# Patient Record
Sex: Female | Born: 2000 | Race: White | Hispanic: No | Marital: Married | Smoking: Never smoker
Health system: Southern US, Community
[De-identification: ages and names within clinical notes are randomized; demographics above are authoritative.]

## PROBLEM LIST (undated history)

## (undated) DIAGNOSIS — L709 Acne, unspecified: Secondary | ICD-10-CM

## (undated) DIAGNOSIS — T7840XA Allergy, unspecified, initial encounter: Secondary | ICD-10-CM

## (undated) HISTORY — DX: Acne, unspecified: L70.9

## (undated) HISTORY — DX: Allergy, unspecified, initial encounter: T78.40XA

---
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
## 2018-01-13 ENCOUNTER — Other Ambulatory Visit: Payer: Self-pay | Admitting: Family Medicine

## 2018-01-13 DIAGNOSIS — Z30011 Encounter for initial prescription of contraceptive pills: Secondary | ICD-10-CM

## 2018-01-13 NOTE — Telephone Encounter (Signed)
Pt needing refills on:  norethindrone-ethinyl estradiol (MICROGESTIN,JUNEL,LOESTRIN) 1-20 MG-MCG tablet Pt only has 3 days left of Rx.   Please fill at:  [facility] [address] [place] [phone] (Phone) [phone] (Fax)    Thanks, [employer]

## 2018-03-03 ENCOUNTER — Ambulatory Visit (INDEPENDENT_AMBULATORY_CARE_PROVIDER_SITE_OTHER): Payer: 59 | Admitting: Family Medicine

## 2018-03-03 ENCOUNTER — Encounter: Payer: Self-pay | Admitting: Family Medicine

## 2018-03-03 VITALS — BP 118/82 | HR 110 | Temp 98.4°F | Wt 145.6 lb

## 2018-03-03 DIAGNOSIS — J069 Acute upper respiratory infection, unspecified: Secondary | ICD-10-CM | POA: Diagnosis not present

## 2018-03-03 NOTE — Patient Instructions (Signed)
Discussed use of Mucinex D for congestion, Delsym for cough, and Benadryl for postnasal drainage 

## 2018-03-03 NOTE — Progress Notes (Signed)
  Subjective:     Patient ID: Shelly Andersen, female   DOB: 02-21-2000, 18 y.o.   MRN: [id number] Chief Complaint  Patient presents with  . URI    Patient presents today for upper respiratory infection since Thursday, 02/27/2018. Patient is having the following symptoms: cough, scratchy throat, ear pressure and pain.    HPI Reports sinus and ear pressure but no fever or chills. Has taken Mucinex for her sx.  Review of Systems     Objective:   Physical Exam Constitutional:      General: She is not in acute distress.    Appearance: She is ill-appearing.  Neurological:     Mental Status: She is alert.   Ears: T.M's intact without inflammation Sinuses: mild maxillary sinus tenderness Throat: tonsils are absent. Neck: no cervical adenopathy Lungs: clear     Assessment:    1. URI, acute     Plan:    Discussed use of Mucinex D for congestion, Delsym for cough, and Benadryl for postnasal drainage. Will call if sinuses to improving over the course of the week.

## 2018-12-01 ENCOUNTER — Telehealth: Payer: Self-pay | Admitting: Family Medicine

## 2018-12-01 DIAGNOSIS — N6310 Unspecified lump in the right breast, unspecified quadrant: Secondary | ICD-10-CM

## 2018-12-01 NOTE — Telephone Encounter (Signed)
Pt returned missed call.  Please call pt back. ° °Thanks, °TGH °

## 2018-12-01 NOTE — Telephone Encounter (Signed)
-----   Message from Birdie Sons, MD sent at 12/16/2017  7:53 AM EDT ----- Regarding: FW: repeat u/s right breast 6 months (12/10/2017)  Done 12-11-2017. Repeat in 12 months.    ----- Message ----- From: Birdie Sons, MD Sent: 12/12/2017 To: Birdie Sons, MD Subject: FW: repeat u/s right breast 6 months (10/22/#    ----- Message ----- From: Birdie Sons, MD Sent: 12/06/2017 To: Birdie Sons, MD Subject: FW: repeat u/s right breast 6 months (10/22/#    ----- Message ----- From: Birdie Sons, MD Sent: 06/18/2017 To: Birdie Sons, MD Subject: repeat u/s right breast 6 months (May 2019)

## 2018-12-01 NOTE — Telephone Encounter (Signed)
Patient has been advised, please schedule as below. Thanks. KW

## 2018-12-01 NOTE — Telephone Encounter (Signed)
[id number] 

## 2018-12-01 NOTE — Telephone Encounter (Signed)
Please advise patient is time for follow up ultrasound right breast (see u/s 12/11/2017)

## 2018-12-15 ENCOUNTER — Telehealth: Payer: Self-pay

## 2018-12-15 ENCOUNTER — Ambulatory Visit
Admission: RE | Admit: 2018-12-15 | Discharge: 2018-12-15 | Disposition: A | Discharge status: Home / Self Care | Payer: Managed Care, Other (non HMO) | Source: Ambulatory Visit | Attending: Family Medicine | Admitting: Family Medicine

## 2018-12-15 ENCOUNTER — Encounter: Payer: Self-pay | Admitting: Family Medicine

## 2018-12-15 DIAGNOSIS — N6310 Unspecified lump in the right breast, unspecified quadrant: Secondary | ICD-10-CM | POA: Diagnosis present

## 2018-12-15 NOTE — Telephone Encounter (Signed)
-----   Message from Birdie Sons, MD sent at 12/15/2018 12:07 PM EDT ----- Follow up ultrasound indicates mass Is getting smaller and is very likely a benign fibroadenoma. No need for any further imaging until she is 38 unless there is dramatic change.

## 2018-12-15 NOTE — Telephone Encounter (Signed)
LMTCB

## 2018-12-17 NOTE — Telephone Encounter (Signed)
Patient was contacted and notified of the providers recommendations and results. She gave verbal understanding. Message from Birdie Sons, MD sent at 12/15/2018 12:07 PM EDT ----- Follow up ultrasound indicates mass Is getting smaller and is very likely a benign fibroadenoma. No need for any further imaging until she is 38 unless there is dramatic change.

## 2018-12-17 NOTE — Telephone Encounter (Signed)
Pt returned call ° °Teri °

## 2018-12-17 NOTE — Telephone Encounter (Signed)
LMTCB

## 2019-01-16 ENCOUNTER — Other Ambulatory Visit: Payer: Self-pay | Admitting: Family Medicine

## 2019-01-16 DIAGNOSIS — Z30011 Encounter for initial prescription of contraceptive pills: Secondary | ICD-10-CM

## 2019-01-16 MED ORDER — NORETHINDRONE ACET-ETHINYL EST 1-20 MG-MCG PO TABS: 1.0000 | ORAL_TABLET | Freq: Every day | ORAL | 3 refills | Status: DC

## 2019-01-16 NOTE — Telephone Encounter (Signed)
[facility] faxed refill request for the following medications:  MICROGESTIN 1-20 MG-MCG tablet  Last Rx: 01/13/2018 90 day supply with 3 refills LOV: 03/03/2018 with Mikki Santee 02/20/2017 with Dr. Caryn Section Please advise. Thanks TNP

## 2019-02-13 IMAGING — US ULTRASOUND RIGHT BREAST LIMITED
1 series · 9 of 9 positions shown · non-contrast
Comparison: None.

CLINICAL DATA: 16-year-old female complaining of palpable mass in
the right breast.

EXAM:
ULTRASOUND OF THE RIGHT BREAST

[Series 1: ultrasound right breast limited · 0.04mm/px · 9 of 9 slices shown]
[im 1/9]
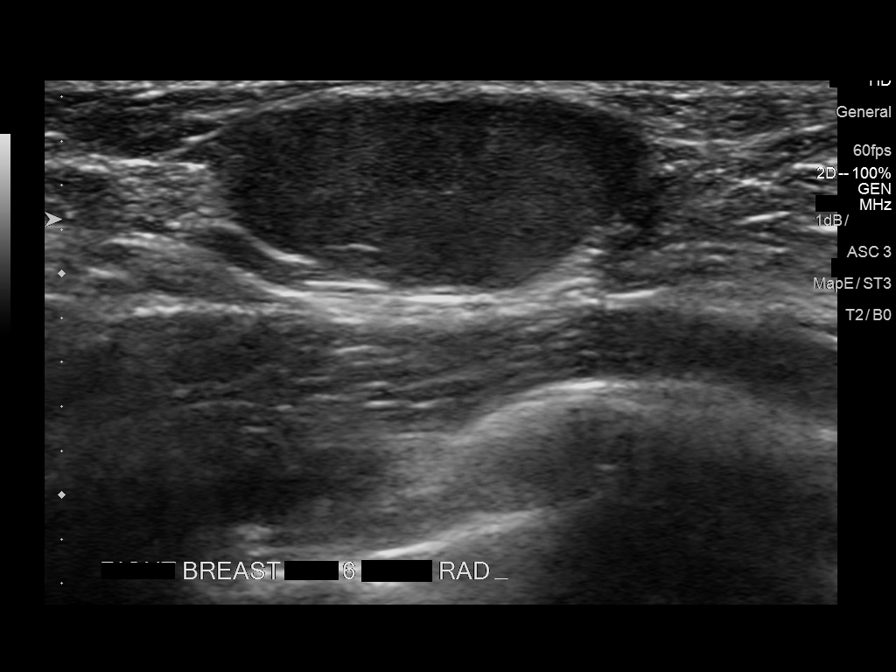
[im 2/9]
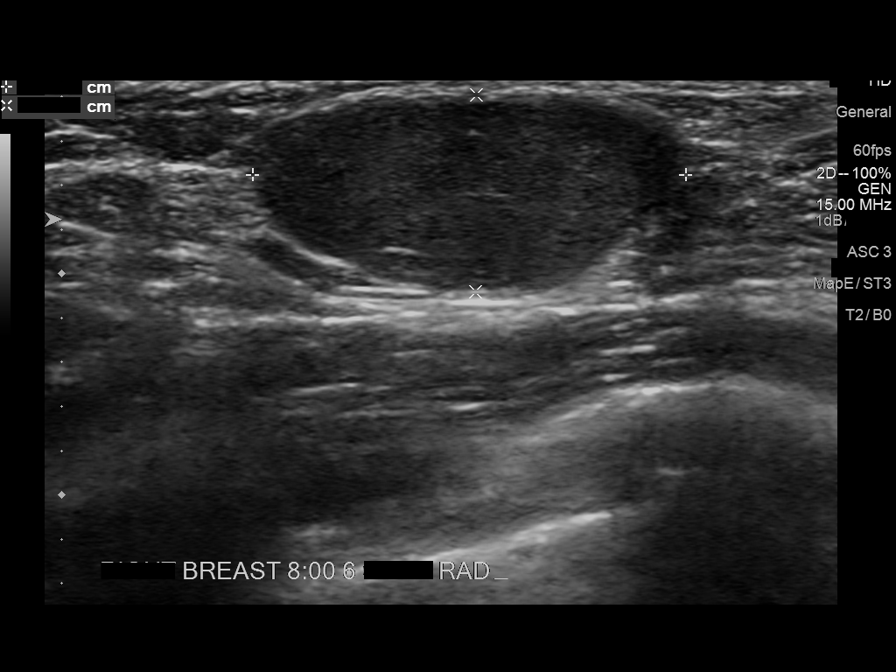
[im 3/9]
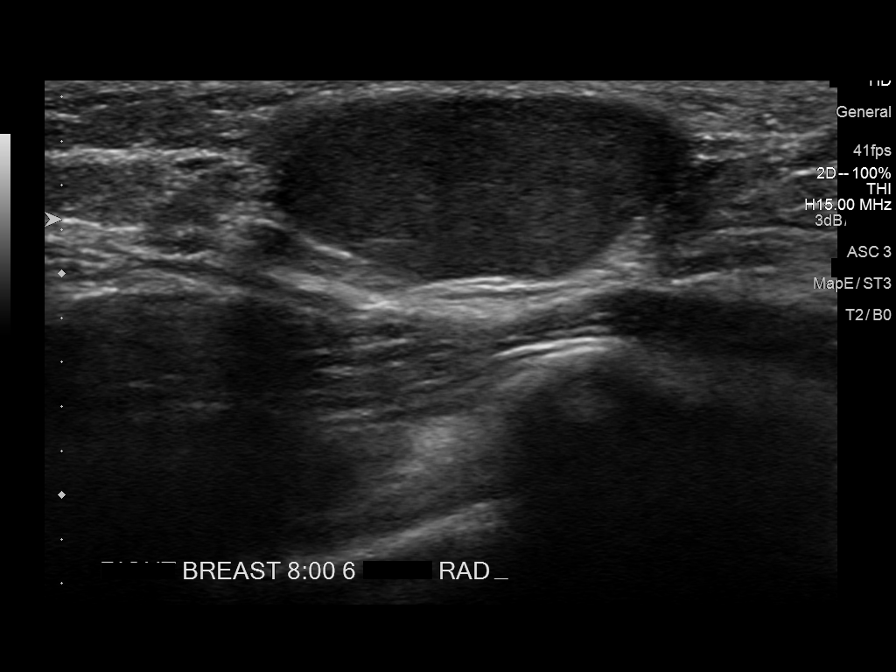
[im 4/9]
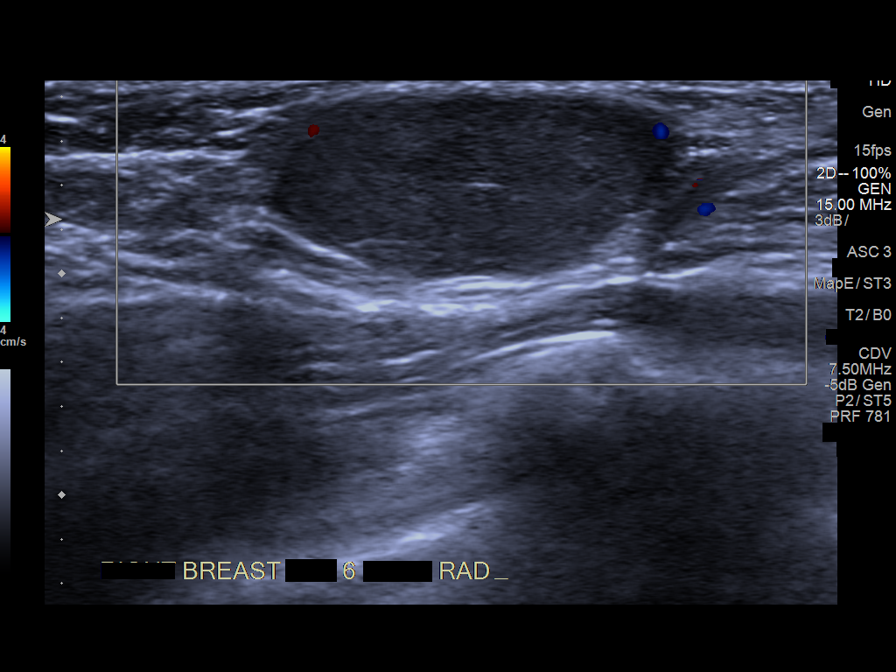
[im 5/9]
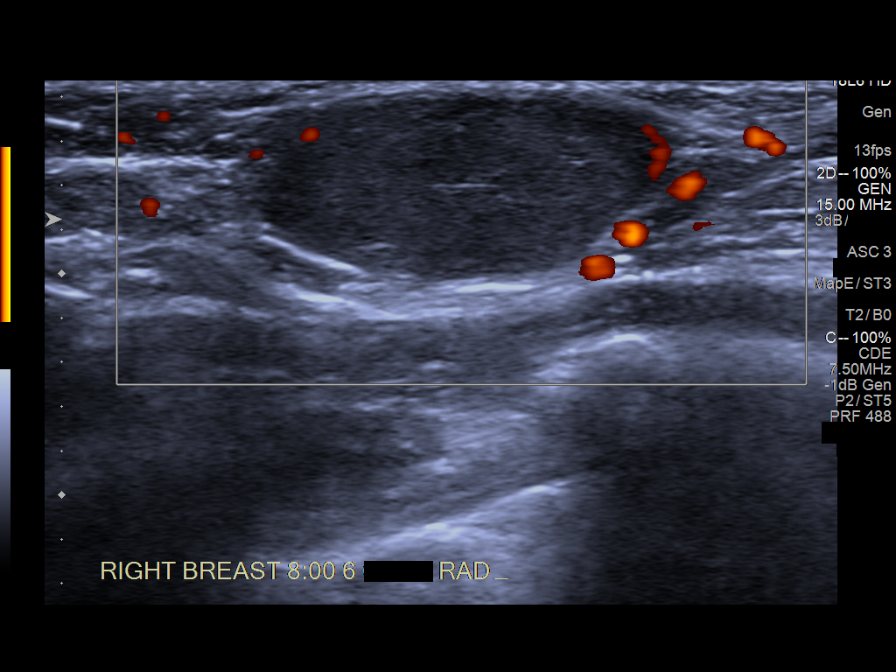
[im 6/9]
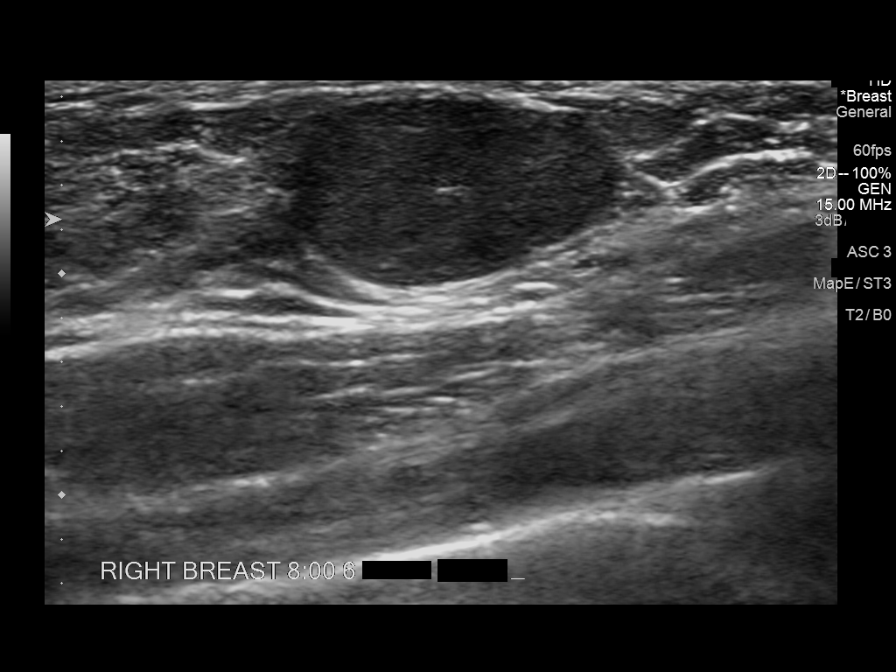
[im 7/9]
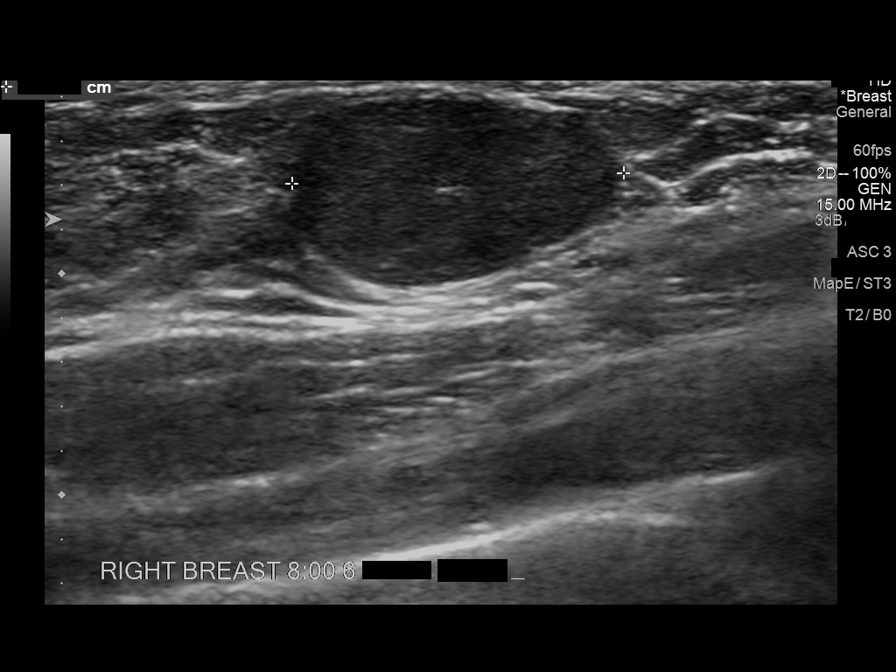
[im 8/9]
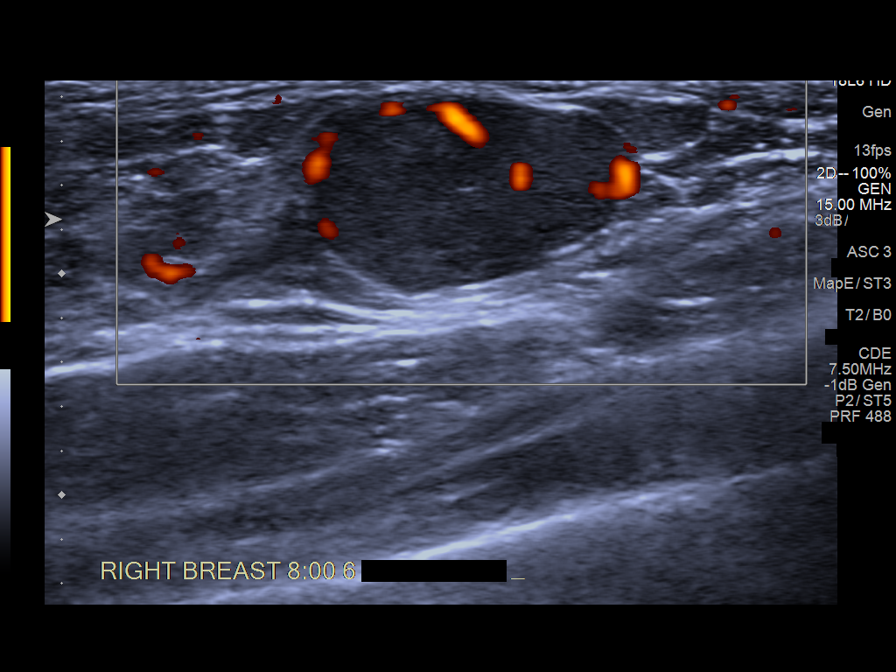
[im 9/9]
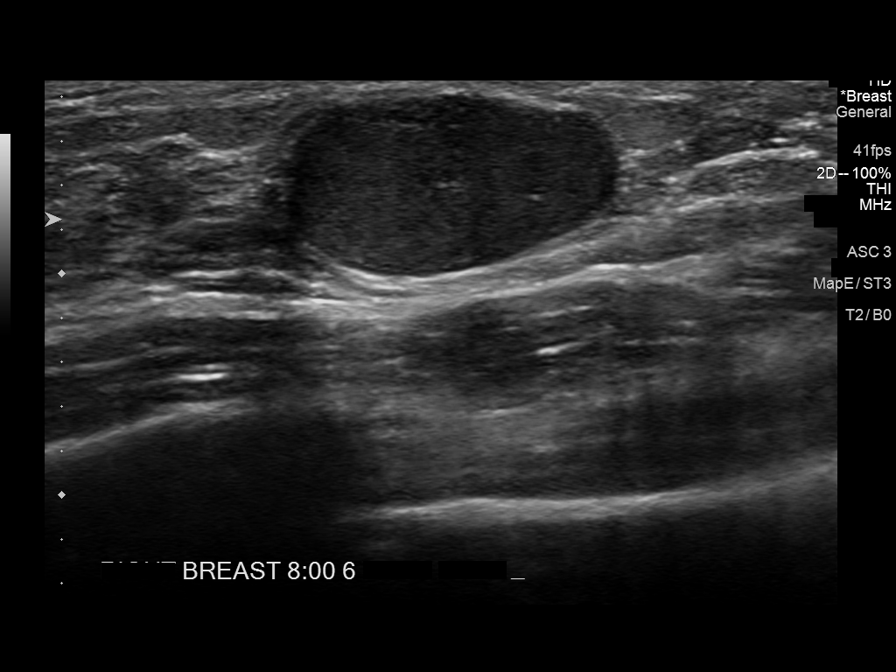

[9 of 9 positions shown; findings below may reference images not displayed]

FINDINGS: On physical exam, I palpate a freely mobile mass in the right breast
at 8 o'clock 6 cm from the nipple.

Targeted ultrasound is performed, showing a well-circumscribed
hypoechoic mass in the right breast at 8 o'clock 6 cm from the
nipple with increased through transmission measuring 2.0 x 0.9 x
cm. This is likely a benign fibroadenoma.
IMPRESSION: Probable benign fibroadenoma in the right breast.

RECOMMENDATION:
Short-term interval follow-up ultrasound in 6 months,
ultrasound-guided core biopsy and surgical excision were discussed
with the patient and her mother. Short-term interval follow-up
ultrasound in 6 months will be performed. The importance of
self-breast examination was discussed.

I have discussed the findings and recommendations with the patient.
Results were also provided in writing at the conclusion of the
visit. If applicable, a reminder letter will be sent to the patient
regarding the next appointment.

BI-RADS CATEGORY  3: Probably benign.

## 2019-04-14 ENCOUNTER — Encounter: Payer: Self-pay | Admitting: Orthopaedic Surgery

## 2019-04-14 ENCOUNTER — Other Ambulatory Visit: Payer: Self-pay

## 2019-04-14 ENCOUNTER — Ambulatory Visit (INDEPENDENT_AMBULATORY_CARE_PROVIDER_SITE_OTHER): Payer: Self-pay

## 2019-04-14 ENCOUNTER — Ambulatory Visit (INDEPENDENT_AMBULATORY_CARE_PROVIDER_SITE_OTHER): Payer: Self-pay | Admitting: Orthopaedic Surgery

## 2019-04-14 DIAGNOSIS — M25561 Pain in right knee: Secondary | ICD-10-CM

## 2019-04-14 MED ORDER — TRAMADOL HCL 50 MG PO TABS: 50.0000 mg | ORAL_TABLET | Freq: Three times a day (TID) | ORAL | 1 refills | Status: DC | PRN

## 2019-04-14 NOTE — Progress Notes (Signed)
Office Visit Note   Patient: Shelly Andersen           Date of Birth: 2000-07-31           MRN: [id number] Visit Date: 04/14/2019              Requested by: Birdie Sons, [place],   [id number] PCP: Birdie Sons, MD   Assessment & Plan: Visit Diagnoses:  1. Acute pain of right knee     Plan: Impression is right knee questionable ligamentous injury versus meniscal pathology.  I have called in tramadol.  We will place the patient in a hinged knee brace weightbearing as tolerated.  She will avoid any activity for now.  Ice and elevate for pain and swelling.  We will obtain an MRI at this point to assess for internal derangement.  Follow-up with [place] once has been completed.  Follow-Up Instructions: Return after mri.   Orders:  Orders Placed This Encounter  Procedures  . XR KNEE 3 VIEW RIGHT  . MR Knee Right w/o contrast   Meds ordered this encounter  Medications  . traMADol (ULTRAM) 50 MG tablet    Sig: Take 1 tablet (50 mg total) by mouth 3 (three) times daily as needed.    Dispense:  20 tablet    Refill:  1      Procedures: No procedures performed   Clinical Data: No additional findings.   Subjective: Chief Complaint  Patient presents with  . 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  This occurred approximately 1 week ago while playing volleyball.  She notes that she was in somewhat of a squatting position when she went for a ball into her knee looked into what sounds like valgus.  She had immediate pain and heard a pop to the knee.  She has had pain to the entire knee since.  She describes this as a sharp shooting pain with instability and occasional locking.  Pain is aggravated with bearing weight.  She has been taking ibuprofen without relief of symptoms.  No numbness, tingling or burning.  Review of Systems as detailed in  HPI.  All others reviewed and are negative.   Objective: Vital Signs: There were no vitals taken for this visit.  Physical Exam well-developed and well-nourished female in no acute distress.  Alert and oriented x3.  Ortho Exam examination of her right knee reveals a trace effusion.  Range of motion 0-90 degrees.  She has marked tenderness to the medial and lateral joint lines.  She does have pain with valgus and varus stress, but she does not really open up.  Negative anterior drawer and Lachman.  She is neurovascular intact distally.  Specialty Comments:  No specialty comments available.  Imaging: XR KNEE 3 VIEW RIGHT  Result Date: 04/14/2019 No acute or structural abnormalities    PMFS History: Patient Active Problem List   Diagnosis Date Noted  . 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 Acne 12/08/2014  . Allergic rhinitis 12/08/2014  . Sprain of hand, thumb, right 12/08/2014   Past Medical History:  Diagnosis Date  . Acne   . Allergy     Family History  Problem Relation Age of Onset  . Hypertension Mother   . Migraines Mother   . Stroke Mother  x 2  . Colon polyps Mother   . Hypertension Father   . Migraines Brother   . Heart disease Other   . Pancreatic cancer Maternal Grandmother   . Heart attack Maternal Grandfather   . Kidney failure Paternal Grandfather     Past Surgical History:  Procedure Laterality Date  . TONSILLECTOMY AND ADENOIDECTOMY  2002   Social History   Occupational History  . Occupation: Ship broker    Comment: currently in 10th grade  Tobacco Use  . Smoking status: Never Smoker  . Smokeless tobacco: Never Used  Substance and Sexual Activity  . Alcohol use: No    Alcohol/week: 0.0 standard drinks  . Drug use: No  . Sexual activity: Not on file

## 2019-04-15 ENCOUNTER — Ambulatory Visit: Payer: Managed Care, Other (non HMO) | Admitting: Orthopaedic Surgery

## 2019-04-17 ENCOUNTER — Telehealth: Payer: Self-pay | Admitting: Orthopaedic Surgery

## 2019-04-17 NOTE — Telephone Encounter (Signed)
Pt stated she had an appt on 04-13-19 and was told pain medication would be called in, pt states she called her [facility] on [address] and the prescription hasn't beencalled in.

## 2019-04-17 NOTE — Telephone Encounter (Signed)
Patient aware that it was actually sent into the [facility] on her chart. She said that was fine but to take off the [facility] and to have [facility] be her only pharmacy on her chart

## 2019-05-05 ENCOUNTER — Encounter: Payer: Self-pay | Admitting: Physician Assistant

## 2019-05-05 ENCOUNTER — Ambulatory Visit (INDEPENDENT_AMBULATORY_CARE_PROVIDER_SITE_OTHER): Payer: BC Managed Care – PPO | Admitting: Physician Assistant

## 2019-05-05 ENCOUNTER — Other Ambulatory Visit: Payer: Self-pay

## 2019-05-05 VITALS — BP 115/79 | HR 101 | Temp 97.5°F | Wt 149.0 lb

## 2019-05-05 DIAGNOSIS — K121 Other forms of stomatitis: Secondary | ICD-10-CM

## 2019-05-05 DIAGNOSIS — K0510 Chronic gingivitis, plaque induced: Secondary | ICD-10-CM

## 2019-05-05 MED ORDER — MAGIC MOUTHWASH: 5.0000 mL | Freq: Three times a day (TID) | ORAL | 0 refills | Status: DC | PRN

## 2019-05-05 MED ORDER — VALACYCLOVIR HCL 1 G PO TABS: 1000.0000 mg | ORAL_TABLET | Freq: Two times a day (BID) | ORAL | 0 refills | Status: DC

## 2019-05-05 NOTE — Progress Notes (Signed)
Patient: Shelly Andersen Female    DOB: November 05, 2000   19 y.o.   MRN: [id number] Visit Date: 05/05/2019  Today's Provider: Trinna Post, PA-C   Chief Complaint  Patient presents with  . Oral Swelling    Gums and tongue.    Subjective:     HPI   Patient presents today with swollen gums and mouth sores x several days. She reports this happened over night. Reports her gums are swollen and tender. She does go to a dentist regularly. She is having mouth pain that is interfering with eating. Patient has been eating soft foods like mashed potatoes and maccaroni and cheese due to the pain. She denies fevers, history of cold sores. She did recently restart her combination OCP in January. She is currently having her period.   No Known Allergies   Current Outpatient Medications:  .  norethindrone-ethinyl estradiol (MICROGESTIN) 1-20 MG-MCG tablet, Take 1 tablet by mouth daily. 21 days on, 7 days off, Disp: 63 tablet, Rfl: 3 .  HYDROcodone-homatropine (HYCODAN) 5-1.5 MG/5ML syrup, Take 5 mLs by mouth every 8 (eight) hours as needed for cough. (Patient not taking: Reported on 04/14/2019), Disp: 100 mL, Rfl: 0 .  magic mouthwash SOLN, Take 5 mLs by mouth 3 (three) times daily as needed for mouth pain., Disp: 240 mL, Rfl: 0 .  methocarbamol (ROBAXIN) 500 MG tablet, Take 1 tablet (500 mg total) by mouth 2 (two) times daily as needed for muscle spasms (1 PO BID PRN). (Patient not taking: Reported on 04/14/2019), Disp: 20 tablet, Rfl: 0 .  traMADol (ULTRAM) 50 MG tablet, Take 1 tablet (50 mg total) by mouth 3 (three) times daily as needed., Disp: 20 tablet, Rfl: 1 .  valACYclovir (VALTREX) 1000 MG tablet, Take 1 tablet (1,000 mg total) by mouth 2 (two) times daily., Disp: 21 tablet, Rfl: 0  Review of Systems  Constitutional: Negative.   HENT: Positive for mouth sores and trouble swallowing. Negative for congestion, ear discharge, ear pain, postnasal drip, rhinorrhea, sinus pressure, sinus pain  and sore throat.   Neurological: Negative for dizziness, light-headedness and headaches.    Social History   Tobacco Use  . Smoking status: Never Smoker  . Smokeless tobacco: Never Used  Substance Use Topics  . Alcohol use: No    Alcohol/week: 0.0 standard drinks      Objective:   BP 115/79 (BP Location: Left Arm, Patient Position: Sitting, Cuff Size: Large)   Pulse (!) 101   Temp (!) 97.5 F (36.4 C) (Temporal)   Wt 149 lb (67.6 kg)   BMI 22.99 kg/m  Vitals:   05/05/19 1511  BP: 115/79  Pulse: (!) 101  Temp: (!) 97.5 F (36.4 C)  TempSrc: Temporal  Weight: 149 lb (67.6 kg)  Body mass index is 22.99 kg/m.   Physical Exam Constitutional:      Appearance: Normal appearance. She is not ill-appearing.  HENT:     Mouth/Throat:     Lips: Pink.     Mouth: Oral lesions present.     Dentition: Gingival swelling present. No dental tenderness, dental caries, dental abscesses or gum lesions.     Comments: Multiple ulcerative lesions on tongue and gumlines.  Neurological:     Mental Status: She is alert and oriented to person, place, and time. Mental status is at baseline.  Psychiatric:        Mood and Affect: Mood normal.        Behavior: Behavior  normal.      No results found for any visits on 05/05/19.     Assessment & Plan    1. Gingivitis  Will treat for HSV infection due to gingival pain and mouth ulcers. Have sent in magic mouthwash for pain relief. If these is not working, will consider changing birth control.   - valACYclovir (VALTREX) 1000 MG tablet; Take 1 tablet (1,000 mg total) by mouth 2 (two) times daily.  Dispense: 21 tablet; Refill: 0 - magic mouthwash SOLN; Take 5 mLs by mouth 3 (three) times daily as needed for mouth pain.  Dispense: 240 mL; Refill: 0  2. Mouth ulcers  The entirety of the information documented in the History of Present Illness, Review of Systems and Physical Exam were personally obtained by me. Portions of this information were  initially documented by Ashley Royalty, CMA and reviewed by me for thoroughness and accuracy.        Trinna Post, PA-C  [facility]

## 2019-05-05 NOTE — Patient Instructions (Signed)
Oral Ulcers Oral ulcers are small sores inside the mouth or near the mouth. They may occur on or inside the lips, inside the cheeks, on the tongue, or anywhere else inside or near the mouth. They may be called canker sores or cold sores, which are two types of oral ulcers. Many oral ulcers are harmless and go away on their own. In some cases, oral ulcers may require medical care to determine the cause and proper treatment. What are the causes? Common causes of this condition include:  Infections caused by viruses, bacteria, or fungi.  Emotional stress.  Foods or chemicals that irritate the mouth.  Injury or physical irritation of the mouth.  Medicines.  Allergies.  Tobacco use. Less common causes include:  Skin disease.  A type of herpes virus infection (herpes simplexor herpes zoster).  Oral cancer. In some cases, the cause may not be known. What increases the risk? You are more likely to develop this condition if:  You wear dental braces, dentures, or retainers.  You have poor oral hygiene.  You have sensitive skin.  You have a condition that affects the entire body (systemic condition), such as an immune disorder. What are the signs or symptoms? The main symptom of this condition is having one or more oval-shaped or round ulcers that have red borders. Symptoms may vary depending on the cause. This includes:  Location of the ulcers. Ulcers may be found inside the mouth, on the gums, or on the insides of the lips or cheeks. They may also be found on the lips or on skin that is near the mouth, such as the cheeks or chin.  Pain. Ulcers can be painful and uncomfortable, or they can be painless.  Appearance of the ulcers. They may look like red blisters and be filled with fluid, or they may be white or yellow patches.  Frequency of outbreaks. Ulcers may go away permanently after one outbreak, or they may come back (recur) often or rarely. How is this diagnosed? This  condition is diagnosed with a physical exam. Your health care provider may ask you questions about your lifestyle and your medical history. You may have tests, including:  Blood tests.  Removal of a small number of cells from an ulcer to be examined under a microscope (biopsy). How is this treated? Treatment depends on the severity and cause of the condition. Oral ulcers often go away on their own in 1-2 weeks. Treatment may include medicines, such as:  Medicines to treat a viral infection (antivirals), a bacterial infection (antibiotics), or a fungal infection (antifungals).  Medicines to help control pain. This may include: ? Over-the-counter pain medicines. ? Gel, cream, or spray to numb the area (topical anesthetic) if you have severe pain. ? Other medicines to coat or numb your mouth. Follow these instructions at home: Medicines  Take or use over-the-counter and prescription medicines only as told by your health care provider.  If you were prescribed an antibiotic medicine, take it as told by your health care provider. Do not stop taking the antibiotic even if you start to feel better.  Do not use products that contain benzocaine (including numbing gels) to treat teething or mouth pain in children who are younger than 2 years. These products may cause a rare but serious blood condition. Eating and drinking  Eat a balanced diet. Do not eat: ? Spicy foods. ? Citrus, such as oranges. ? Other foods and drinks that you think may cause or irritate your ulcers.    Drink enough fluid to keep your urine pale yellow.  Avoid drinking alcohol. Lifestyle   Practice good oral hygiene: ? Gently brush your teeth with a soft toothbrush two times a day. ? Floss your teeth every day. ? Get regular dental cleanings and checkups.  Do not use any products that contain nicotine or tobacco, such as cigarettes and e-cigarettes. If you need help quitting, ask your health care provider. Managing  pain  If directed, put ice on your face in the affected area to help reduce pain. ? Put ice in a plastic bag. ? Place a towel between your skin and the bag. ? Leave the ice on for 20 minutes, 2-3 times a day.  Avoid physical or chemical irritants that may have caused the ulcers or made them worse, such as mouthwashes that contain alcohol (ethanol). If you wear dental braces, dentures, or retainers, work with your health care provider to make sure these devices are fitted correctly.  If you were prescribed a prescription mouthwash to help reduce pain in your mouth, use it as told by your health care provider. General instructions  Rinse with a salt-water mixture 3-4 times a day or as told by your health care provider. To make a salt-water mixture, completely dissolve -1 tsp (3-6 g) of salt in 1 cup (237 mL) of warm water.  Keep all follow-up visits as told by your health care provider. This is important. Contact a health care provider if:  You have: ? Pain that gets worse or does not get better with medicine. ? Four or more ulcers at one time. ? A fever. ? New ulcers that look or feel different from other ulcers you have. ? Inflammation in one eye or both eyes. ? Ulcers that do not go away after 10 days.  You develop new symptoms in your mouth, such as: ? Bleeding or crusting around your lips or gums. ? Tooth pain. ? Difficulty swallowing.  You develop symptoms on your skin or genitals, such as: ? A rash or blisters. ? Burning or itching sensations.  Your ulcers begin or get worse after you start a new medicine. Get help right away if you have:  Difficulty breathing.  Swelling in your face or neck.  Excessive bleeding from your mouth.  Severe pain. Summary  Oral ulcers may occur anywhere inside or near the mouth.  They can be caused by many things, such as infections, stress, injury or irritation, or tobacco use.  Oral ulcers can be painful or painless.  Treatment  may include medicines to relieve pain or to treat an infection (if appropriate).  Most oral ulcers go away in 1-2 weeks. This information is not intended to replace advice given to you by your health care provider. Make sure you discuss any questions you have with your health care provider. Document Revised: 06/20/2017 Document Reviewed: 06/20/2017 Elsevier Patient Education  2020 Elsevier Inc.  

## 2019-07-31 IMAGING — US ULTRASOUND RIGHT BREAST LIMITED
1 series · 7 of 7 positions shown · non-contrast
Comparison: Previous exam(s).

CLINICAL DATA: Short-term follow-up. Patient has a mass in the
right breast, evaluated on 12/24/2016 and felt likely to be a
fibroadenoma. Short-term follow-up is recommended.

EXAM:
ULTRASOUND OF THE RIGHT BREAST

[Series 1: ultrasound right breast limited · 0.06mm/px · 7 of 7 slices shown]
[im 1/7]
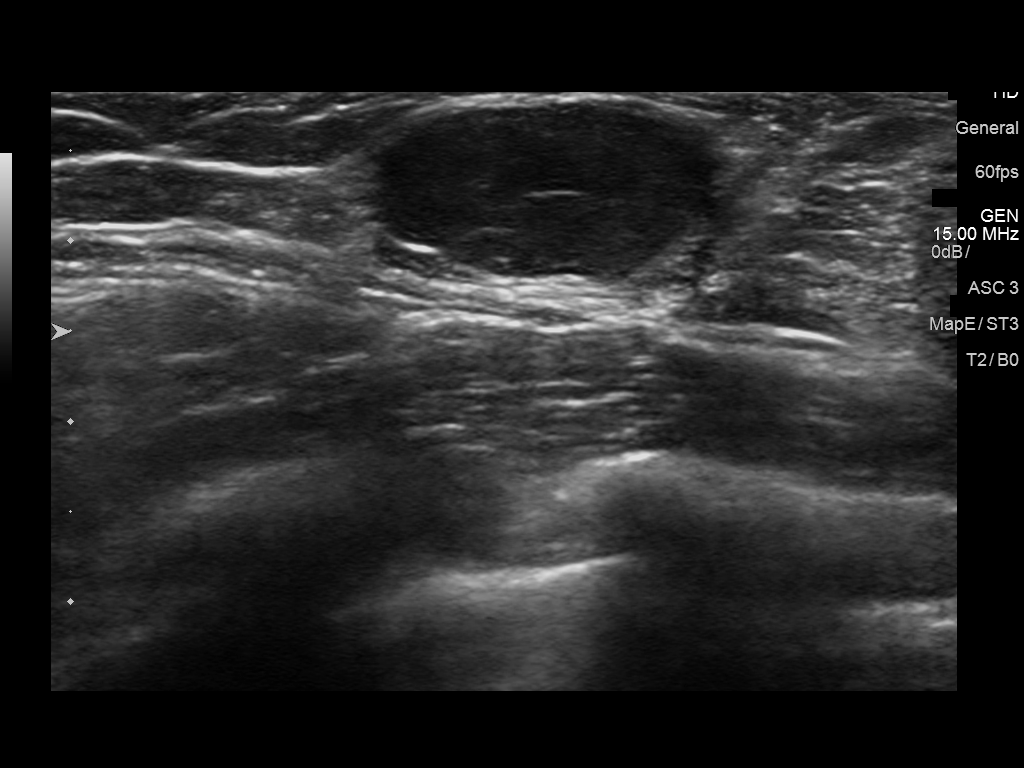
[im 2/7]
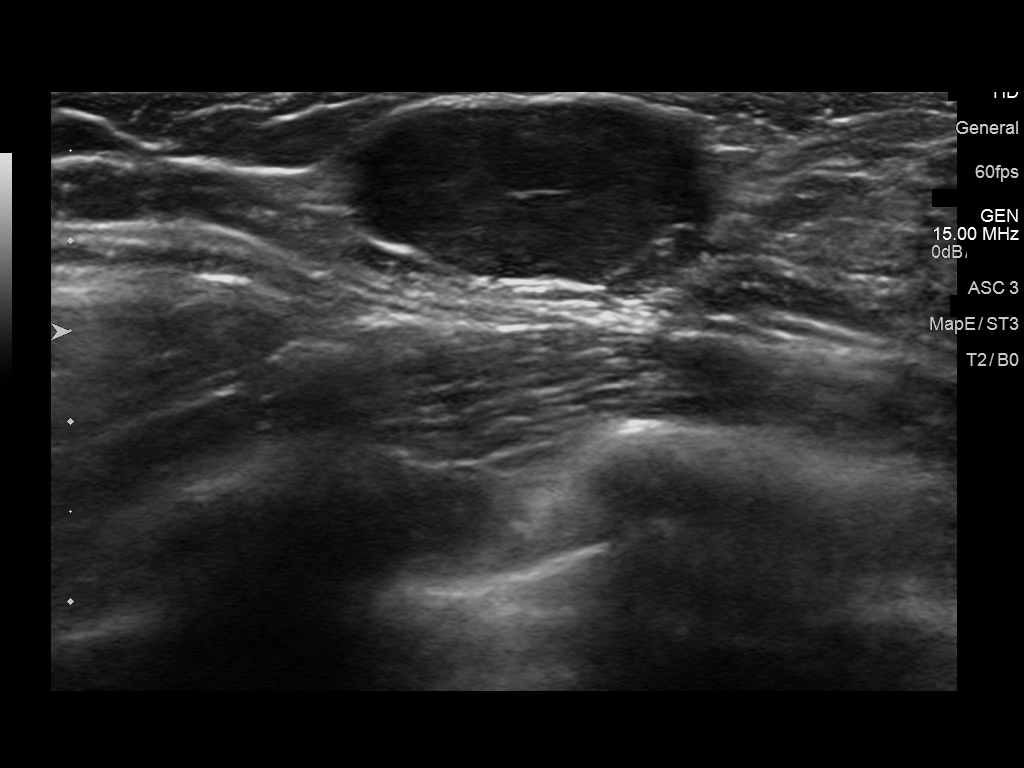
[im 3/7]
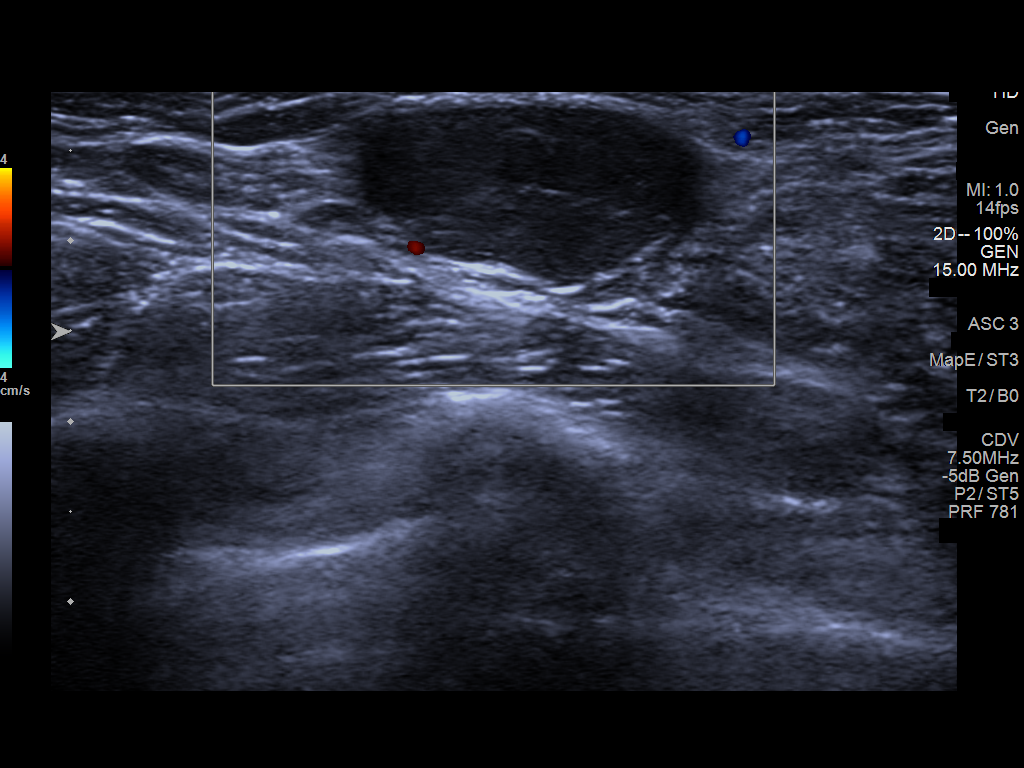
[im 4/7]
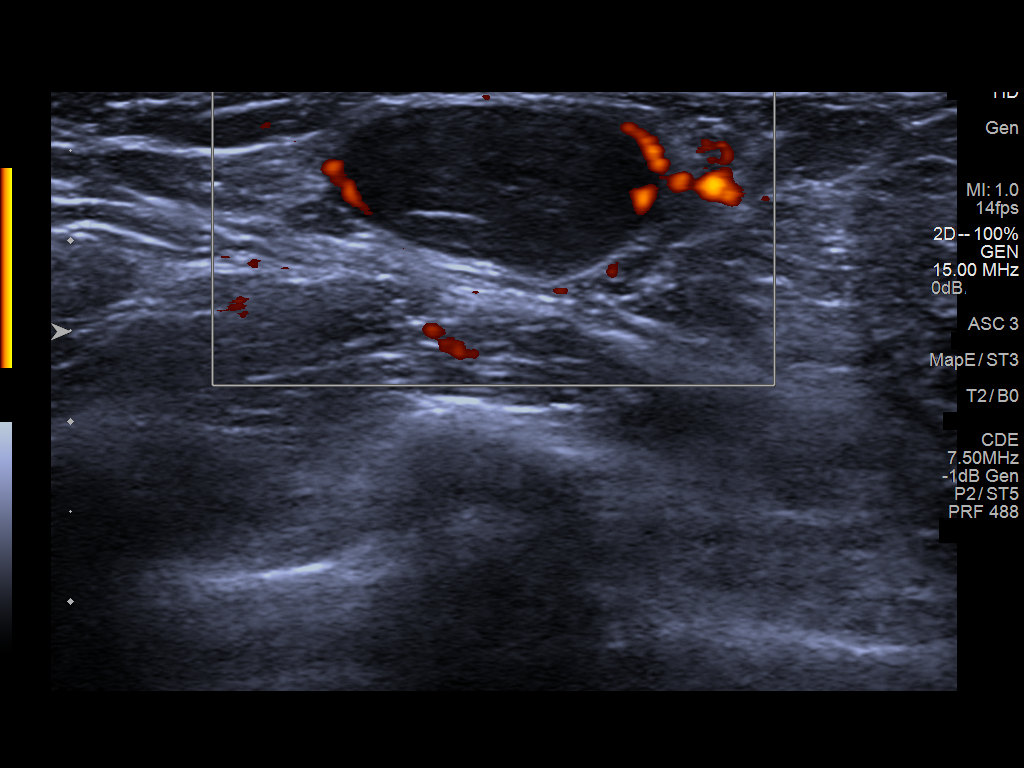
[im 5/7]
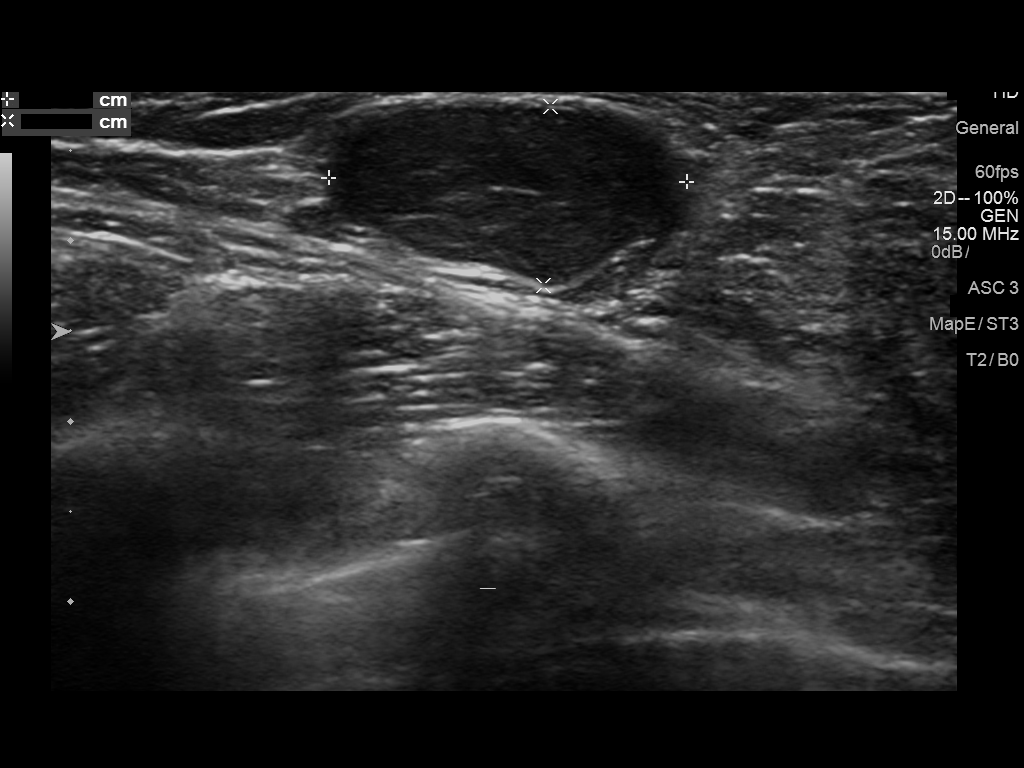
[im 6/7]
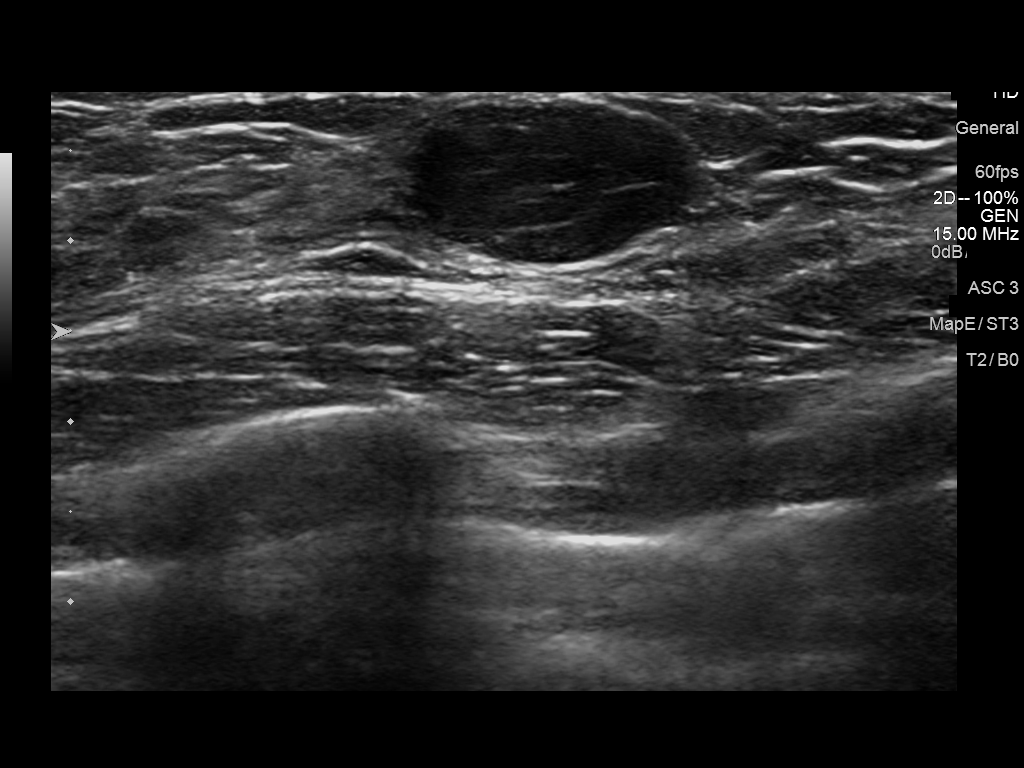
[im 7/7]
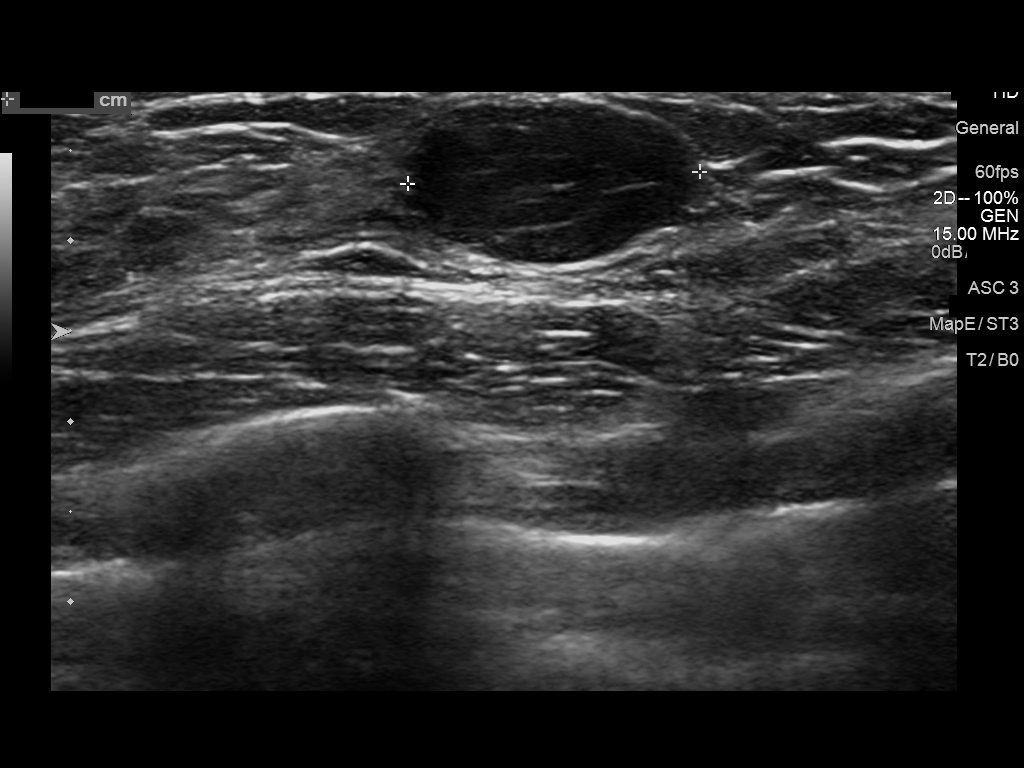

[7 of 7 positions shown; findings below may reference images not displayed]

FINDINGS: Targeted ultrasound is performed, showing an oval, hypoechoic,
circumscribed mass in the right breast 8 o'clock, 6 cm the nipple,
measuring 2.0 x 1.0 x 1.6 cm, without change from the prior study
allowing for slight measurement technique differences.
IMPRESSION: 1. Probably benign mass in the right breast at 8 o'clock, most
likely a fibroadenoma, stable for 6 months. Additional short-term
follow-up recommended.

RECOMMENDATION:
Right breast ultrasound in 6 months.

I have discussed the findings and recommendations with the patient.
Results were also provided in writing at the conclusion of the
visit. If applicable, a reminder letter will be sent to the patient
regarding the next appointment.

BI-RADS CATEGORY  3: Probably benign.

## 2020-01-31 IMAGING — US US BREAST*R* LIMITED INC AXILLA
1 series · 6 of 6 positions shown · non-contrast
Comparison: Previous exam(s).

CLINICAL DATA: Short-term follow-up right breast mass.

EXAM:
ULTRASOUND OF THE RIGHT BREAST

[Series 1: us breast*right* limited inc axilla · 0.05mm/px · 6 of 6 slices shown]
[im 1/6]
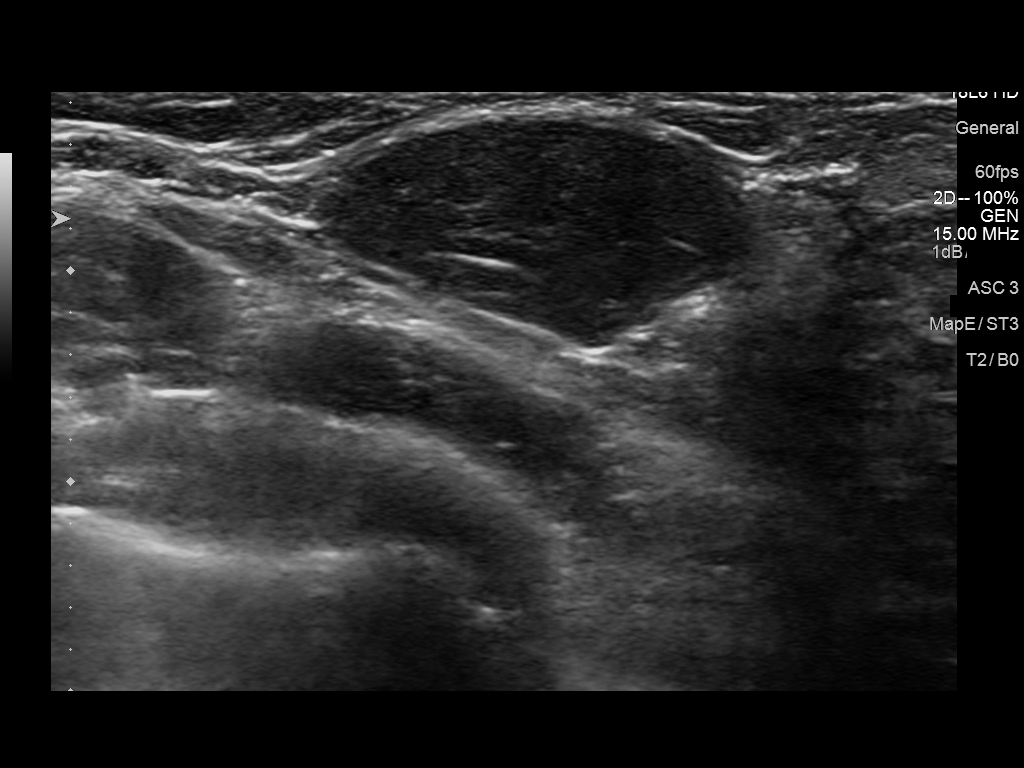
[im 2/6]
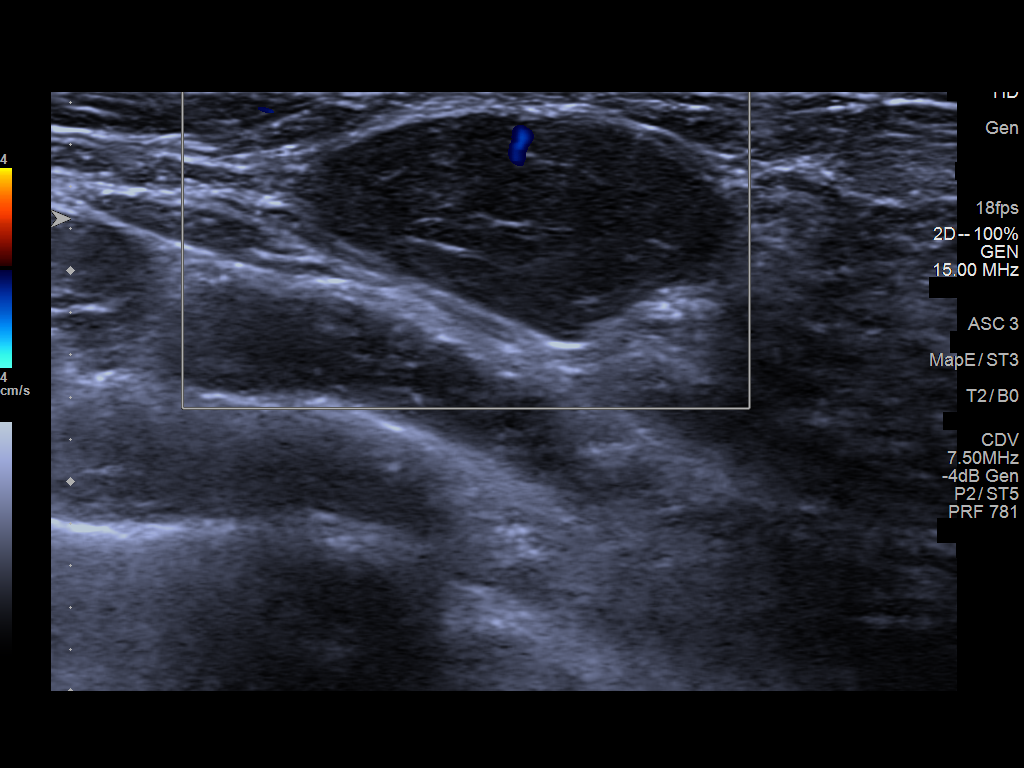
[im 3/6]
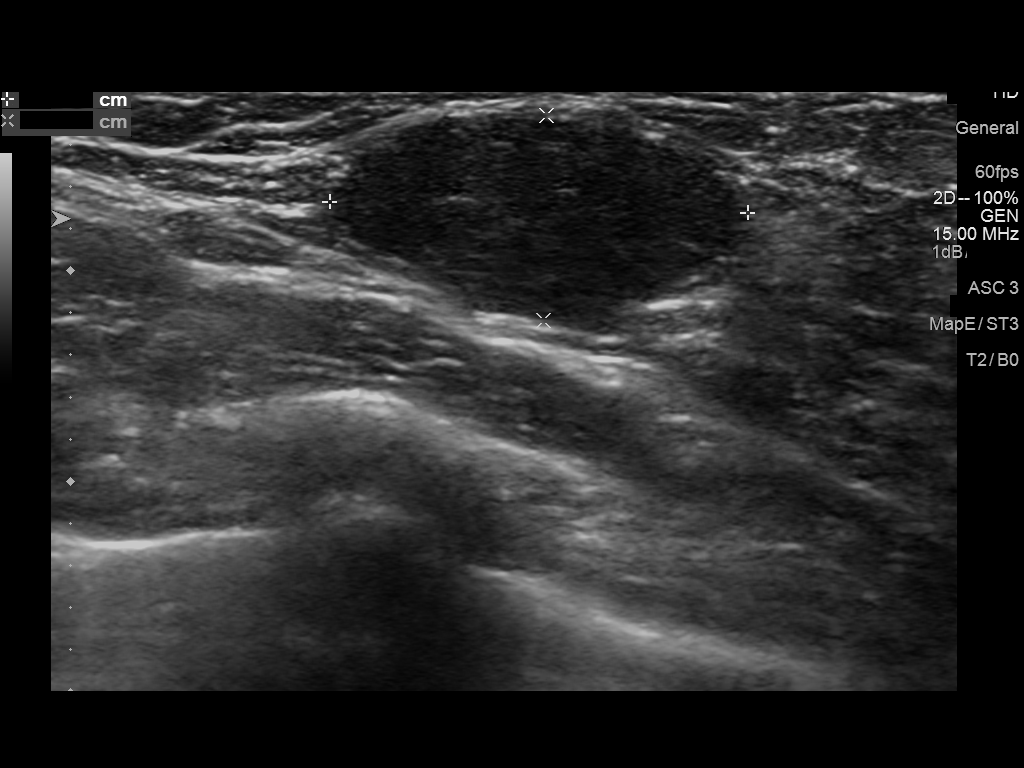
[im 4/6]
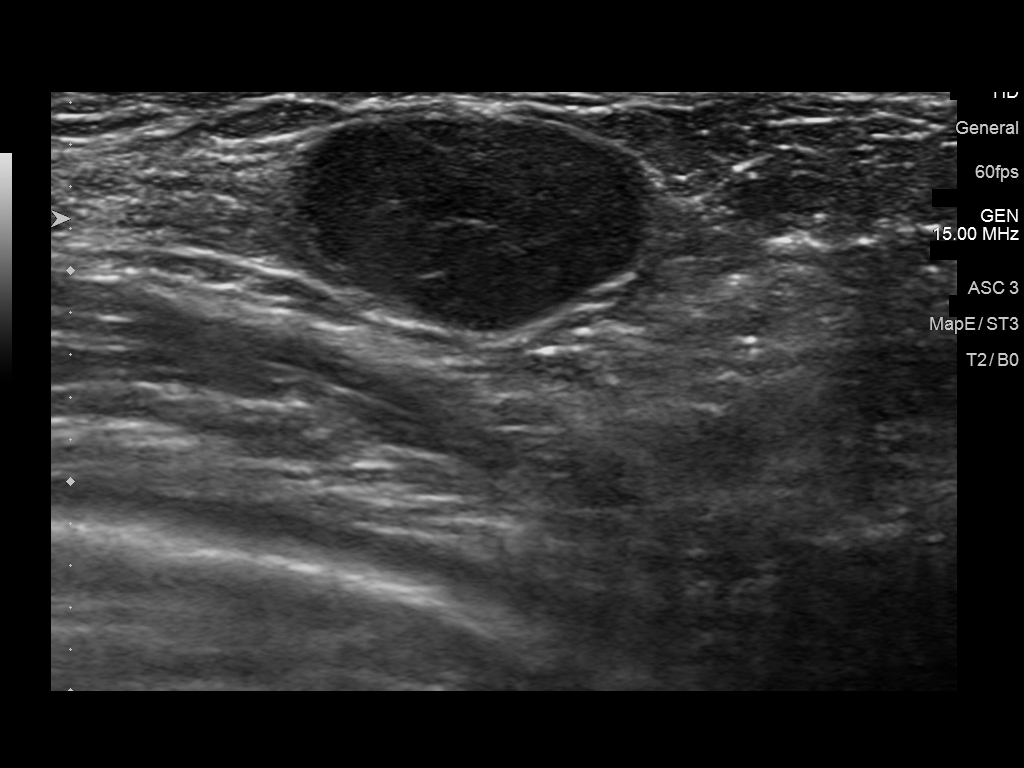
[im 5/6]
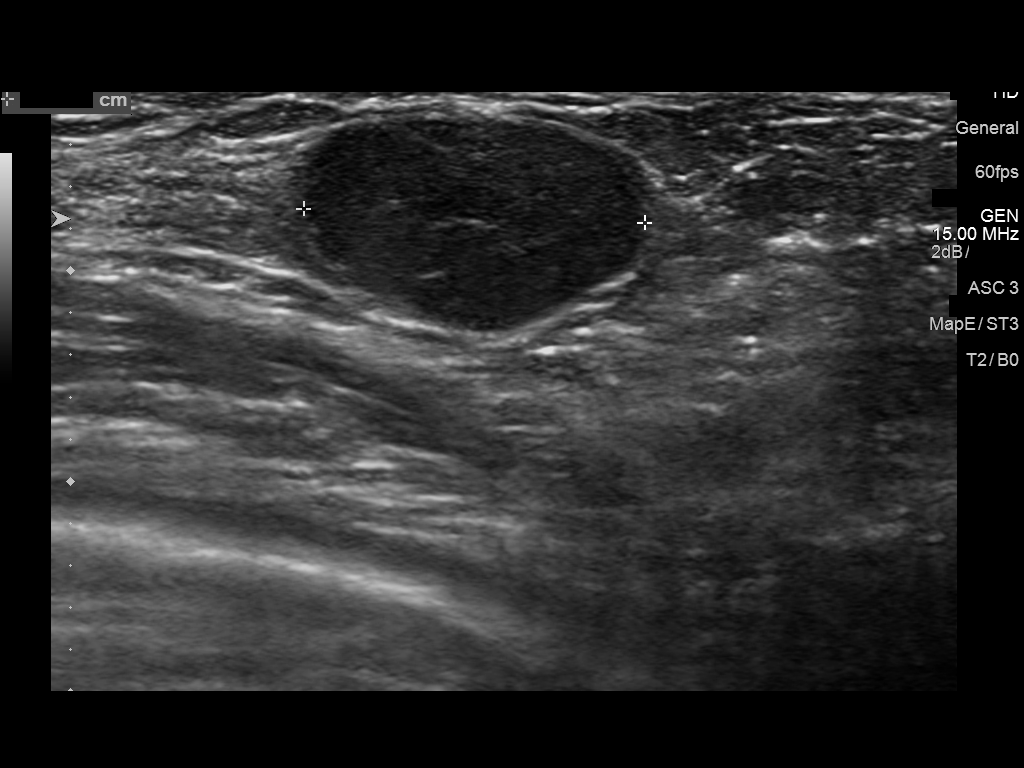
[im 6/6]
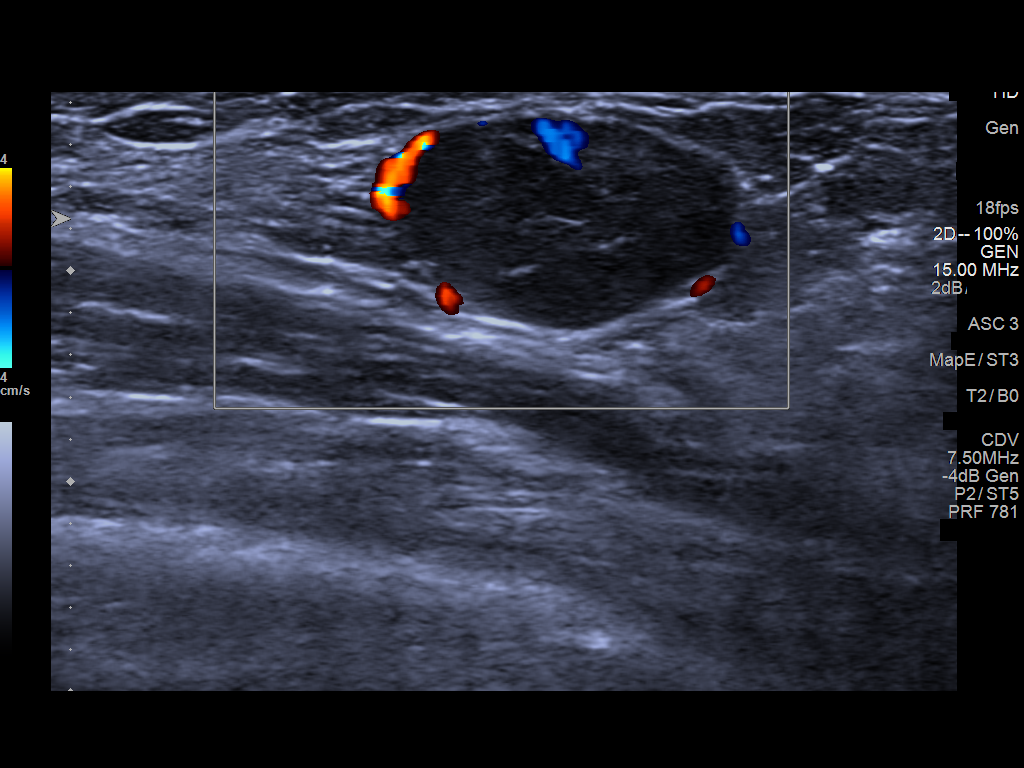

[6 of 6 positions shown; findings below may reference images not displayed]

FINDINGS: Targeted ultrasound is performed, showing 2 x 1 x 1.6 cm oval
hypoechoic lesion at the right breast 8 o'clock 6 cm from nipple
stable compared prior exam.
IMPRESSION: Probable benign findings.

RECOMMENDATION:
Twelve month follow-up ultrasound right breast.

I have discussed the findings and recommendations with the patient.
Results were also provided in writing at the conclusion of the
visit. If applicable, a reminder letter will be sent to the patient
regarding the next appointment.

BI-RADS CATEGORY  3: Probably benign.

## 2020-05-16 ENCOUNTER — Other Ambulatory Visit: Payer: Self-pay

## 2020-05-16 ENCOUNTER — Encounter: Payer: Self-pay | Admitting: Family Medicine

## 2020-05-16 ENCOUNTER — Ambulatory Visit (INDEPENDENT_AMBULATORY_CARE_PROVIDER_SITE_OTHER): Payer: BC Managed Care – PPO | Admitting: Family Medicine

## 2020-05-16 VITALS — BP 120/85 | HR 73 | Temp 98.3°F | Resp 16 | Ht 68.0 in | Wt 159.8 lb

## 2020-05-16 DIAGNOSIS — Z30016 Encounter for initial prescription of transdermal patch hormonal contraceptive device: Secondary | ICD-10-CM | POA: Diagnosis not present

## 2020-05-16 MED ORDER — NORELGESTROMIN-ETH ESTRADIOL 150-35 MCG/24HR TD PTWK: MEDICATED_PATCH | TRANSDERMAL | 12 refills | Status: DC

## 2020-05-16 NOTE — Progress Notes (Signed)
Established patient visit   Patient: Shelly Andersen   DOB: 09/17/2000   19 y.o. Female  MRN: [id number] Visit Date: 05/16/2020  Today's healthcare provider: Lelon Huh, MD   Chief Complaint  Patient presents with  . Contraception   Subjective    Subjective:  Shelly Andersen is a 20 y.o. female who presents for contraception counseling. The patient has no complaints today. The patient is sexually active. Pertinent past medical history: none. Patient does dip   Menstrual History: regular   She was previously on OCPs but states she had an allergic reaction when the brand was changed and has never started back on it. She is starting as a Engineer, structural soon and is concerned her schedule may be too hectic to remember to take a pill. She doesn't want an implant of Depot shot either. She is interested in the patch or an IUD.   No Known Allergies     Medications: Outpatient Medications Prior to Visit  Medication Sig  . norethindrone-ethinyl estradiol (MICROGESTIN) 1-20 MG-MCG tablet Take 1 tablet by mouth daily. 21 days on, 7 days off (Patient not taking: Reported on 05/16/2020)  . [DISCONTINUED] HYDROcodone-homatropine (HYCODAN) 5-1.5 MG/5ML syrup Take 5 mLs by mouth every 8 (eight) hours as needed for cough. (Patient not taking: Reported on 04/14/2019)  . [DISCONTINUED] magic mouthwash SOLN Take 5 mLs by mouth 3 (three) times daily as needed for mouth pain.  . [DISCONTINUED] methocarbamol (ROBAXIN) 500 MG tablet Take 1 tablet (500 mg total) by mouth 2 (two) times daily as needed for muscle spasms (1 PO BID PRN). (Patient not taking: Reported on 04/14/2019)  . [DISCONTINUED] traMADol (ULTRAM) 50 MG tablet Take 1 tablet (50 mg total) by mouth 3 (three) times daily as needed.  . [DISCONTINUED] valACYclovir (VALTREX) 1000 MG tablet Take 1 tablet (1,000 mg total) by mouth 2 (two) times daily.   No facility-administered medications prior to visit.    Review of Systems   Constitutional: Negative for appetite change, chills and fatigue.  Respiratory: Negative for chest tightness and shortness of breath.   Cardiovascular: Negative for chest pain and palpitations.        Objective    BP 120/85 (BP Location: Left Arm, Patient Position: Sitting, Cuff Size: Large)   Pulse 73   Temp 98.3 F (36.8 C) (Oral)   Resp 16   Ht 5\' 8"  (1.727 m)   Wt 159 lb 12.8 oz (72.5 kg)   LMP 05/09/2020 (Exact Date)   SpO2 100%   BMI 24.30 kg/m    Physical Exam  General appearance: Well developed, well nourished female, cooperative and in no acute distress Head: Normocephalic, without obvious abnormality, atraumatic Respiratory: Respirations even and unlabored, normal respiratory rate Extremities: All extremities are intact.  Skin: Skin color, texture, turgor normal. No rashes seen  Psych: Appropriate mood and affect. Neurologic: Mental status: Alert, oriented to person, place, and time, thought content appropriate.     Assessment & Plan     1. Encounter for initial prescription of transdermal patch hormonal contraceptive device  - norelgestromin-ethinyl estradiol (ORTHO EVRA) 150-35 MCG/24HR transdermal patch; Place 1 patch onto the skin and leave in place for 7 days.  Change patch every 7 days for 21 days. Then go without a patch for 7 days.  Repeat every 28 days.  Dispense: 3 patch; Refill: 12   Advised to call for gyn referral if she decides to change to IUD.    Recommended HPV vaccine which  she declined.      The entirety of the information documented in the History of Present Illness, Review of Systems and Physical Exam were personally obtained by me. Portions of this information were initially documented by the CMA and reviewed by me for thoroughness and accuracy.      Lelon Huh, MD  [facility] [phone] (phone) [phone] (fax)  [place]

## 2020-05-31 ENCOUNTER — Telehealth: Payer: Self-pay | Admitting: Family Medicine

## 2020-05-31 DIAGNOSIS — Z30016 Encounter for initial prescription of transdermal patch hormonal contraceptive device: Secondary | ICD-10-CM

## 2020-05-31 NOTE — Telephone Encounter (Signed)
Caller states norelgestromin-ethinyl estradiol (ORTHO EVRA) 150-35 MCG/24HR transdermal patch is causing a burning sensation and through the day feeling nausea. Patient requesting alternate patch only due to a bad reaction for pill. 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 Patient has 1 patch left   [place], [place] Phone:  [phone]  Fax:  [phone]

## 2020-06-01 ENCOUNTER — Other Ambulatory Visit: Payer: Self-pay

## 2020-06-01 DIAGNOSIS — Z30016 Encounter for initial prescription of transdermal patch hormonal contraceptive device: Secondary | ICD-10-CM

## 2020-06-01 NOTE — Telephone Encounter (Signed)
Copied from [place] [phone]. Topic: General - Other >> Jun 01, 2020  1:54 PM Yvette Rack wrote: Reason for CRM: Pt mother called for an update on the request for an alternate patch.

## 2020-06-02 MED ORDER — TWIRLA 120-30 MCG/24HR TD PTWK: MEDICATED_PATCH | TRANSDERMAL | 12 refills | Status: DC

## 2020-06-02 NOTE — Telephone Encounter (Signed)
Tried calling and no answer. Unable to leave VM due to box being full. Ok for [facility] to advise of below if pt returns call.

## 2020-06-02 NOTE — Telephone Encounter (Signed)
A prescription for Shelly Andersen was sent to her pharmacy. It might not be covered on her insurance but she go to [employer] site for discount if it's not.

## 2020-06-03 NOTE — Telephone Encounter (Signed)
Patient called and given information below from Dr. Caryn Section, patient verbalized understanding.

## 2020-06-03 NOTE — Addendum Note (Signed)
Addended by: Matilde Sprang on: 06/03/2020 12:30 PM   Modules accepted: Orders

## 2020-06-03 NOTE — Telephone Encounter (Addendum)
Pts mom called and stated that the pharmacy told her they did not receive the Rx for Levonorgestrel-Eth Estradiol [facility]) 120-30 MCG/24HR [place] Can this please be resubmitted to the pharmacy today / please advise   [place] [address], [place] - [place]  [place], [place] [address]  Phone:  [phone] Fax:  [phone]

## 2020-06-03 NOTE — Telephone Encounter (Signed)
Requested medication (s) are due for refill today: Yes  Requested medication (s) are on the active medication list: Yes  Last refill:  06/02/20  Future visit scheduled: No  Notes to clinic:  Pharmacy did not receive, please resend.     Requested Prescriptions  Pending Prescriptions Disp Refills   Levonorgestrel-Eth Estradiol (TWIRLA) 120-30 MCG/24HR PTWK 3 patch 12    Sig: Place 1 patch onto the skin and leave in place for 7 days.  Change patch every 7 days for 21 days. Then go without a patch for 7 days.  Repeat every 28 days.      Off-Protocol Failed - 06/03/2020 12:30 PM      Failed - Medication not assigned to a protocol, review manually.      Passed - Valid encounter within last 12 months    Recent Outpatient Visits           2 weeks ago Encounter for initial prescription of transdermal patch hormonal contraceptive device   [facility] Birdie Sons, MD   1 year ago [place] [facility], Wendee Beavers, [place]   2 years ago URI, acute   [facility], [place], [place]   3 years ago [place], Donald E, MD   3 years ago Lump of right breast   [facility] Birdie Sons, MD

## 2020-06-06 MED ORDER — TWIRLA 120-30 MCG/24HR TD PTWK
MEDICATED_PATCH | TRANSDERMAL | 12 refills | Status: DC
Start: 2020-06-06 — End: 2023-11-25

## 2020-06-06 NOTE — Addendum Note (Signed)
Addended by: Lelon Huh E on: 06/06/2020 11:51 AM   Modules accepted: Orders

## 2020-07-15 ENCOUNTER — Telehealth: Payer: Self-pay

## 2020-07-15 NOTE — Telephone Encounter (Signed)
Copied from [place] [phone]. Topic: General - Other >> Jul 08, 2020  3:33 PM Paw[place], Brayton Layman A wrote: Reason for CRM: Pt wanted to know when her last tetanus shot was and if she is due. Please call back.

## 2020-07-19 NOTE — Telephone Encounter (Signed)
Pt's last Tdap was 05/10/2010 so she is due for one.    Tried calling pt but her voicemail is full.  PEC please advise pt as above and offer an appointment for her Tdap.   Thanks,   -Mickel Baas

## 2020-07-20 NOTE — Telephone Encounter (Signed)
Tried calling patient. Unable to leave message. Mailbox full. OK for [facility] triage to advise if patient returns call.

## 2020-07-21 NOTE — Telephone Encounter (Signed)
Patient called, no answer, unable to leave message due to mailbox is full.

## 2021-01-24 IMAGING — US US BREAST*R* LIMITED INC AXILLA
1 series · 5 of 5 positions shown · non-contrast
Comparison: 12/11/2017, 06/10/2017, 12/24/2016

CLINICAL DATA: Two year followup for probably benign mass in the
RIGHT breast. Patient no longer feels the abnormality in the LOWER
OUTER QUADRANT.

EXAM:
ULTRASOUND OF THE RIGHT BREAST

[Series 1: us breast*right* limited inc axilla · 0.04mm/px · 5 of 5 slices shown]
[im 1/5]
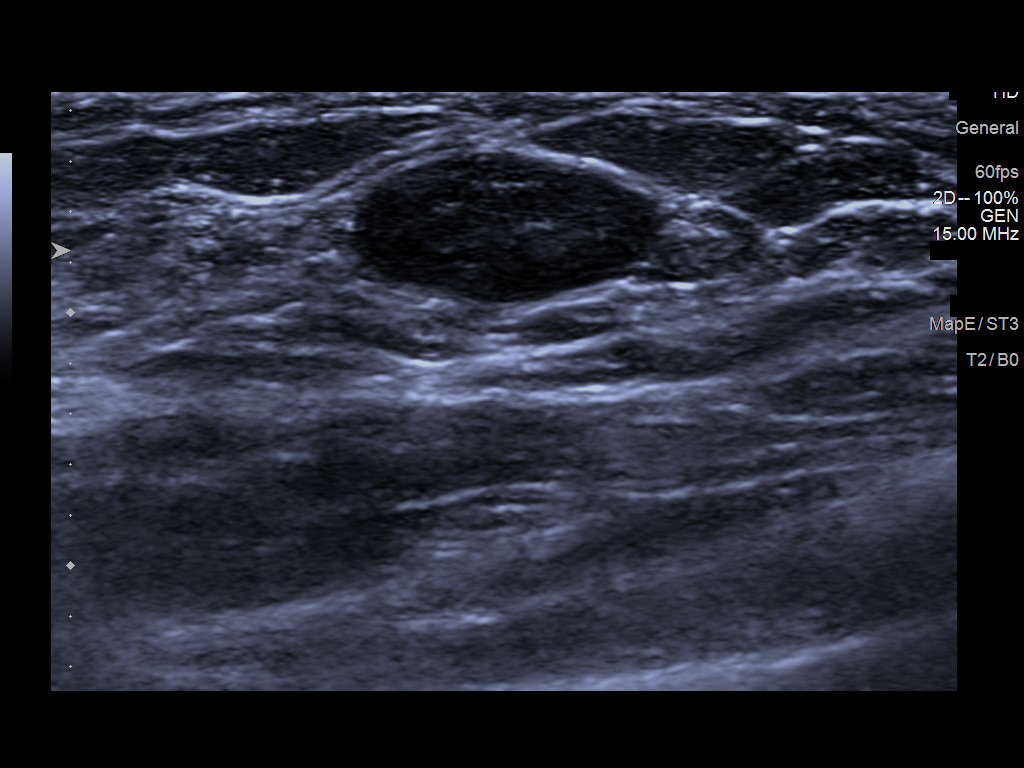
[im 2/5]
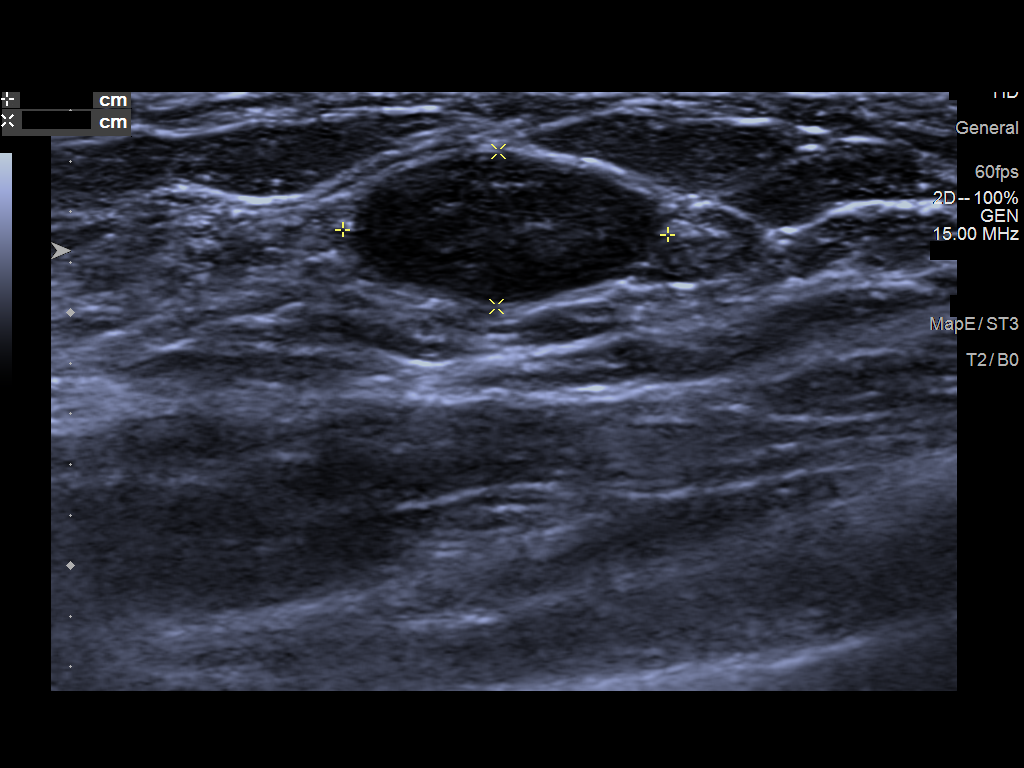
[im 3/5]
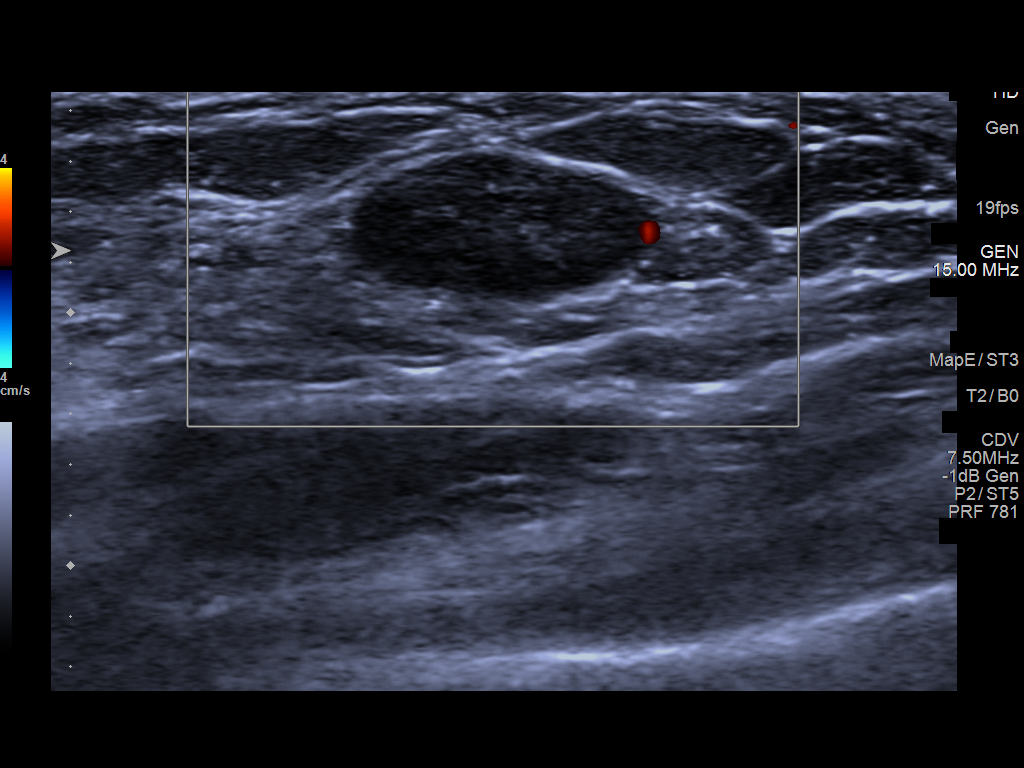
[im 4/5]
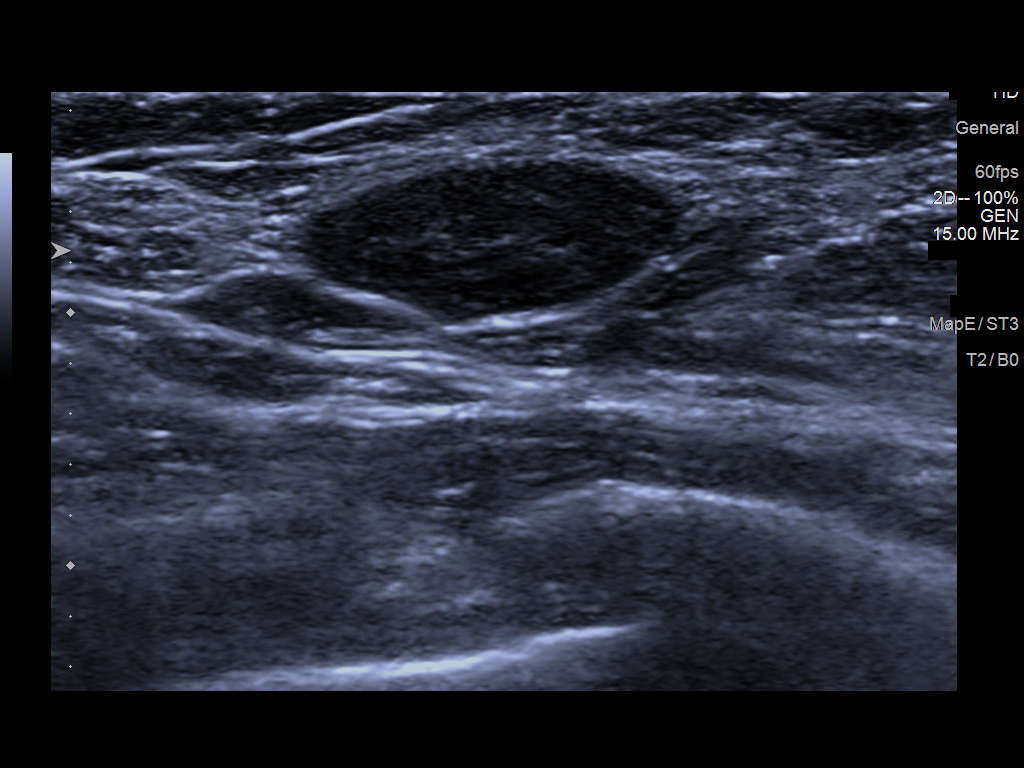
[im 5/5]
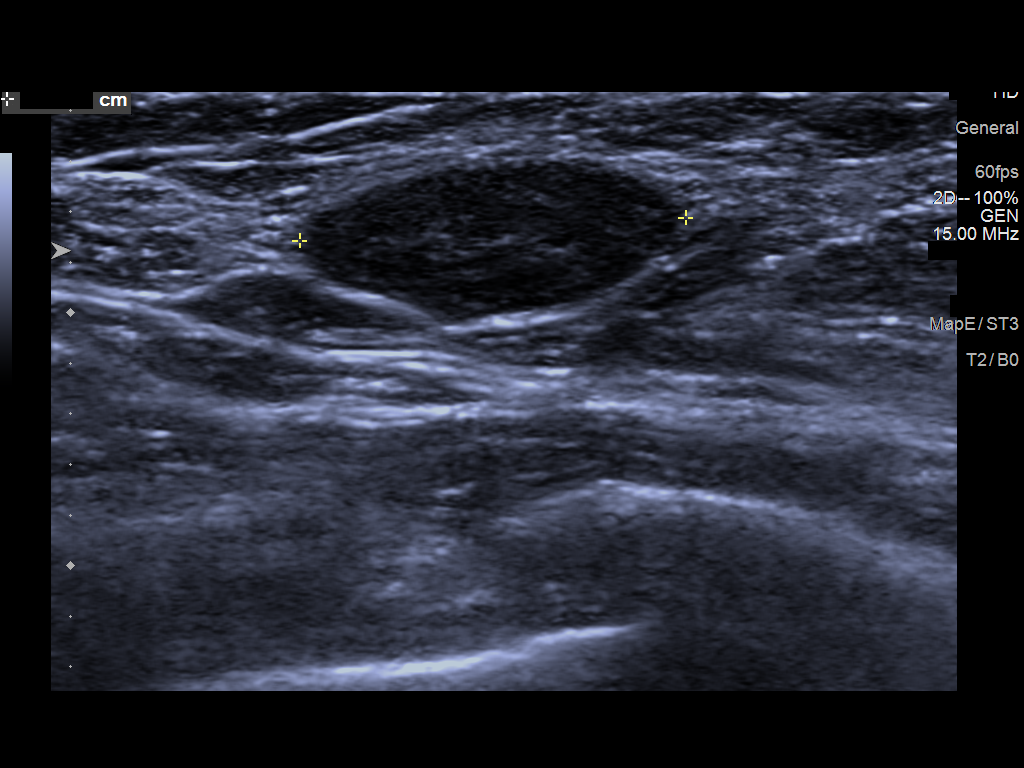

[5 of 5 positions shown; findings below may reference images not displayed]

FINDINGS: Targeted ultrasound is performed, showing a circumscribed oval
hypoechoic parallel mass in the 8 o'clock location of the RIGHT
breast 6 centimeters from the nipple now measuring 1.3 x 0.6 x
centimeters. Originally mass measured 2.0 x 0.9 x 1.5 centimeters.
IMPRESSION: Slightly smaller RIGHT breast fibroadenoma over 2 years of
follow-up. No specific follow-up is necessary for this mass. No
ultrasound evidence for malignancy.

RECOMMENDATION:
Screening mammogram at age 38 unless there are persistent or
intervening clinical concerns. (Code:[id number])

I have discussed the findings and recommendations with the patient.
If applicable, a reminder letter will be sent to the patient
regarding the next appointment.

BI-RADS CATEGORY  2: Benign.

## 2023-05-21 DIAGNOSIS — I471 Supraventricular tachycardia, unspecified: Secondary | ICD-10-CM

## 2023-05-21 DIAGNOSIS — O039 Complete or unspecified spontaneous abortion without complication: Secondary | ICD-10-CM

## 2023-05-21 HISTORY — DX: Complete or unspecified spontaneous abortion without complication: O03.9

## 2023-05-21 HISTORY — DX: Supraventricular tachycardia, unspecified: I47.10

## 2023-06-11 ENCOUNTER — Emergency Department
Admission: EM | Admit: 2023-06-11 | Discharge: 2023-06-11 | Disposition: A | Discharge status: Home / Self Care | Attending: Emergency Medicine | Admitting: Emergency Medicine

## 2023-06-11 ENCOUNTER — Other Ambulatory Visit: Payer: Self-pay

## 2023-06-11 DIAGNOSIS — I471 Supraventricular tachycardia, unspecified: Secondary | ICD-10-CM | POA: Insufficient documentation

## 2023-06-11 DIAGNOSIS — O039 Complete or unspecified spontaneous abortion without complication: Secondary | ICD-10-CM | POA: Diagnosis not present

## 2023-06-11 DIAGNOSIS — R000 Tachycardia, unspecified: Secondary | ICD-10-CM | POA: Diagnosis not present

## 2023-06-11 DIAGNOSIS — I10 Essential (primary) hypertension: Secondary | ICD-10-CM | POA: Diagnosis not present

## 2023-06-11 LAB — CBC WITH DIFFERENTIAL/PLATELET
Abs Immature Granulocytes: 0.06 10*3/uL (ref 0.00–0.07)
Basophils Absolute: 0.1 10*3/uL (ref 0.0–0.1)
Basophils Relative: 0 %
Eosinophils Absolute: 0.1 10*3/uL (ref 0.0–0.5)
Eosinophils Relative: 1 %
HCT: 40 % (ref 36.0–46.0)
Hemoglobin: 14.1 g/dL (ref 12.0–15.0)
Immature Granulocytes: 0 %
Lymphocytes Relative: 19 %
Lymphs Abs: 2.7 10*3/uL (ref 0.7–4.0)
MCH: 31.8 pg (ref 26.0–34.0)
MCHC: 35.3 g/dL (ref 30.0–36.0)
MCV: 90.3 fL (ref 80.0–100.0)
Monocytes Absolute: 1 10*3/uL (ref 0.1–1.0)
Monocytes Relative: 7 %
Neutro Abs: 10 10*3/uL — ABNORMAL HIGH (ref 1.7–7.7)
Neutrophils Relative %: 73 %
Platelets: 340 10*3/uL (ref 150–400)
RBC: 4.43 MIL/uL (ref 3.87–5.11)
RDW: 11.6 % (ref 11.5–15.5)
WBC: 13.9 10*3/uL — ABNORMAL HIGH (ref 4.0–10.5)
nRBC: 0 % (ref 0.0–0.2)

## 2023-06-11 LAB — BASIC METABOLIC PANEL WITH GFR
Anion gap: 9 (ref 5–15)
BUN: 11 mg/dL (ref 6–20)
CO2: 20 mmol/L — ABNORMAL LOW (ref 22–32)
Calcium: 8.9 mg/dL (ref 8.9–10.3)
Chloride: 114 mmol/L — ABNORMAL HIGH (ref 98–111)
Creatinine, Ser: 0.68 mg/dL (ref 0.44–1.00)
GFR, Estimated: >60 mL/min (ref >60)
Glucose, Bld: 101 mg/dL — ABNORMAL HIGH (ref 70–99)
Potassium: 4 mmol/L (ref 3.5–5.1)
Sodium: 143 mmol/L (ref 135–145)

## 2023-06-11 LAB — POC URINE PREG, ED
Preg Test, Ur: NEGATIVE
Preg Test, Ur: NEGATIVE

## 2023-06-11 MED ORDER — ADENOSINE 12 MG/4ML IV SOLN: 12.0000 mg | Freq: Once | INTRAVENOUS | Status: AC

## 2023-06-11 MED ORDER — ADENOSINE 12 MG/4ML IV SOLN
INTRAVENOUS | Status: AC
  Administered 2023-06-11: 6 mg via INTRAVENOUS
  Filled 2023-06-11: qty 4

## 2023-06-11 MED ORDER — ADENOSINE 12 MG/4ML IV SOLN
INTRAVENOUS | Status: AC
  Administered 2023-06-11: 12 mg via INTRAVENOUS
  Filled 2023-06-11: qty 4

## 2023-06-11 MED ORDER — ADENOSINE 6 MG/2ML IV SOLN
6.0000 mg | Freq: Once | INTRAVENOUS | Status: AC
  Filled 2023-06-11: qty 2

## 2023-06-11 NOTE — Discharge Instructions (Signed)
 Follow-up with your primary care provider as needed.  We have also provided a cardiology referral; the cardiology office should contact you for an appointment.  Return to the ER for new, worsening, or persistent severe high heart rate, palpitations, chest pain, lightheadedness, or any other new or worsening symptoms that concern you.

## 2023-06-11 NOTE — ED Triage Notes (Signed)
 Patient arrives from work via EMS after complaining that it felt like her heart was racing.  EMS reported a heartrate of 215bpm.  She failed vagal and adenosine  6mg .  Patient declined additional adenosine .  Patient had an 8 week term miscarriage approximately 3 weeks ago.  No cardiac hx, denies chest pain.

## 2023-06-11 NOTE — ED Provider Notes (Signed)
 [facility] Provider Note    Event Date/Time   First MD Initiated Contact with Patient 06/11/23 1503     (approximate)   History   Tachycardia   HPI  Shelly Andersen is a 23 y.o. female with no significant PMH, not currently on any medications, who presents with palpitations for approximately last 2 hours.  She noted on her watch that her heart rate was very high.  She denies any chest pain, pressure, shortness of breath, or lightheadedness.  She denies any prior history of similar episodes.  I reviewed the past medical records.  Her most recent outpatient encounter that I have access to was a visit at [facility] sports medicine in 2023 for finger pain.   Physical Exam   Triage Vital Signs: ED Triage Vitals  Encounter Vitals Group     BP 06/11/23 1502 (!) 120/99     Systolic BP Percentile --      Diastolic BP Percentile --      Pulse Rate 06/11/23 1502 (!) 176     Resp 06/11/23 1502 18     Temp 06/11/23 1502 98.2 F (36.8 C)     Temp Source 06/11/23 1502 Oral     SpO2 06/11/23 1502 100 %     Weight 06/11/23 1503 197 lb (89.4 kg)     Height 06/11/23 1503 5\' 8"  (1.727 m)     Head Circumference --      Peak Flow --      Pain Score 06/11/23 1500 0     Pain Loc --      Pain Education --      Exclude from Growth Chart --     Most recent vital signs: Vitals:   06/11/23 1600 06/11/23 1630  BP: (!) 139/94 122/87  Pulse: (!) 110   Resp: 15 (!) 22  Temp:    SpO2: 100%     General: Alert, anxious appearing, no distress.  CV:  Good peripheral perfusion.  Resp:  Normal effort.  Tachycardic, regular rhythm. Abd:  No distention.  Other:  No peripheral edema.   ED Results / Procedures / Treatments   Labs (all labs ordered are listed, but only abnormal results are displayed) Labs Reviewed  BASIC METABOLIC PANEL WITH GFR - Abnormal; Notable for the following components:      Result Value   Chloride 114 (*)    CO2 20 (*)    Glucose, Bld 101 (*)     All other components within normal limits  CBC WITH DIFFERENTIAL/PLATELET - Abnormal; Notable for the following components:   WBC 13.9 (*)    Neutro Abs 10.0 (*)    All other components within normal limits  POC URINE PREG, ED  POC URINE PREG, ED     EKG  ED ECG REPORT I, Lind Repine, the attending physician, personally viewed and interpreted this ECG.  Date: 06/11/2023 EKG Time: 1500 Rate: 184 Rhythm: SVT (incorrectly read by machine as sinus tachycardia) QRS Axis: normal Intervals: normal ST/T Wave abnormalities: Repolarization abnormality Narrative Interpretation: SVT with no evidence of acute ischemia   ED ECG REPORT I, Lind Repine, the attending physician, personally viewed and interpreted this ECG.  Date: 06/11/2023 EKG Time: 1520 Rate: 158 Rhythm: Sinus tachycardia QRS Axis: normal Intervals: Prolonged QTc ST/T Wave abnormalities: Repolarization abnormality Narrative Interpretation: no evidence of acute ischemia   ED ECG REPORT I, Lind Repine, the attending physician, personally viewed and interpreted this ECG.  Date: 06/11/2023 EKG Time:  1546 Rate: 119 Rhythm: Sinus tachycardia QRS Axis: normal Intervals: normal ST/T Wave abnormalities: normal Narrative Interpretation: no evidence of acute ischemia     RADIOLOGY    PROCEDURES:  Critical Care performed: Yes, see critical care procedure note(s)  .Critical Care  Performed by: Lind Repine, MD Authorized by: Lind Repine, MD   Critical care provider statement:    Critical care time (minutes):  30   Critical care time was exclusive of:  Separately billable procedures and treating other patients   Critical care was necessary to treat or prevent imminent or life-threatening deterioration of the following conditions:  Cardiac failure   Critical care was time spent personally by me on the following activities:  Development of treatment plan with patient or  surrogate, discussions with consultants, evaluation of patient's response to treatment, examination of patient, ordering and review of laboratory studies, ordering and review of radiographic studies, ordering and performing treatments and interventions, pulse oximetry, re-evaluation of patient's condition, review of old charts and obtaining history from patient or surrogate    MEDICATIONS ORDERED IN ED: Medications  adenosine  (ADENOCARD ) 6 MG/2ML injection 6 mg (6 mg Intravenous Given 06/11/23 1527)  adenosine  (ADENOCARD ) 12 MG/4ML injection 12 mg (12 mg Intravenous Given 06/11/23 1524)     IMPRESSION / MDM / ASSESSMENT AND PLAN / ED COURSE  I reviewed the triage vital signs and the nursing notes.  24 year old female with PMH as noted above presents with palpitations for approximate last 2 hours with no other associated symptoms.  On exam her heart rate is ranging from 180-205; EKG was read by machine as sinus tachycardia, but EKG and monitor shows narrow complex tachycardia with no P waves, consistent with SVT.  Differential diagnosis includes, but is not limited to, paroxysmal SVT, other tachydysrhythmia such as atrial fibrillation.  Patient has no chest pain or other findings to suggest acute ischemia.  She is not hypotensive or unstable and does not require electrical cardioversion.  The patient received 6 mg of adenosine  from EMS with typical symptoms of chest pressure and feeling like her heart stopped, but no relief.  Initially she declined another dose of adenosine  due to these uncomfortable symptoms and wanted to pursue a different method of converting her rhythm, however after further discussion and reassurance she agreed to another dose.  A dose of 12 mg was given, however the IV was not flushed quickly enough and there was no effect.  A second dose of 12 mg was given again with immediate conversion to sinus tachycardia.  We will obtain basic labs and reassess.  Patient's presentation is  most consistent with acute presentation with potential threat to life or bodily function.  The patient is on the cardiac monitor to evaluate for evidence of arrhythmia and/or significant heart rate changes.   ----------------------------------------- 4:49 PM on 06/11/2023 -----------------------------------------  The patient remains borderline tachycardic with a sinus rhythm around 105.  Repeat EKG is normal.  CBC shows mild leukocytosis, likely reactive, and BMP is unremarkable.  The patient remains asymptomatic.  She is stable for discharge home.  I counseled her on the results of workup and plan of care.  I provided cardiology referral.  I gave strict return precautions and she expressed understanding.  FINAL CLINICAL IMPRESSION(S) / ED DIAGNOSES   Final diagnoses:  SVT (supraventricular tachycardia) (HCC)     Rx / DC Orders   ED Discharge Orders          Ordered    Ambulatory referral to Cardiology  Comments: If you have not heard from the Cardiology office within the next 72 hours please call [phone].   06/11/23 1648             Note:  This document was prepared using Dragon voice recognition software and may include unintentional dictation errors.    Lind Repine, MD 06/11/23 1650

## 2023-06-16 DIAGNOSIS — I471 Supraventricular tachycardia, unspecified: Secondary | ICD-10-CM | POA: Insufficient documentation

## 2023-06-16 NOTE — Progress Notes (Unsigned)
 Cardiology Office Note  Date:  06/17/2023   ID:  Shelly Andersen, DOB 2001-01-03, MRN [id number]  PCP:  Lamon Pillow, MD   Chief Complaint  Patient presents with   New Patient (Initial Visit)    Referred by Dr. Demetrios Finders for SVT. Patient c/o racing heart beats at rest.     HPI:  [name] is a 23 year old woman with no prior cardiac history Seen in the emergency room June 11, 2023 for palpitations for the last 2 hours, Diagnosed with SVT treated with adenosine , 6,12, 12 Referred by Dr. Demetrios Finders for consultation of her SVT  Was at work sitting when she developed acute onset tachycardia Reports she is relatively asymptomatic, watch warned of elevated heart rate First epsiode, never had similar symptoms in the past EMS was called, adenosine  given but did not work In the ER EKG confirming SVT rate 180 bpm  given additional doses of adenosine , finally broke with adenosine  12  Denies any stimulants or triggers for her tachycardia  Since then has felt well with no recurrent arrhythmia Active at baseline with no complaints  EKG personally reviewed by myself on todays visit EKG Interpretation Date/Time:  Monday June 17 2023 09:32:59 EDT Ventricular Rate:  86 PR Interval:  116 QRS Duration:  82 QT Interval:  378 QTC Calculation: 452 R Axis:   26  Text Interpretation: Normal sinus rhythm with sinus arrhythmia Normal ECG When compared with ECG of 11-Jun-2023 15:46, No significant change was found Confirmed by Belva Boyden [phone]) on 06/17/2023 10:07:52 AM    PMH:   has a past medical history of Acne, Allergy, and Miscarriage (05/21/2023).  PSH:    Past Surgical History:  Procedure Laterality Date   TONSILLECTOMY AND ADENOIDECTOMY  2002    Current Outpatient Medications  Medication Sig Dispense Refill   Levonorgestrel-Eth Estradiol  (TWIRLA ) 120-30 MCG/24HR PTWK Place 1 patch onto the skin and leave in place for 7 days.  Change patch every 7 days for 21 days.  Then go without a patch for 7 days.  Repeat every 28 days. (Patient not taking: Reported on 06/17/2023) 3 patch 12   No current facility-administered medications for this visit.     Allergies:   Patient has no known allergies.   Social History:  The patient  reports that she has never smoked. Her smokeless tobacco use includes snuff. She reports that she does not drink alcohol and does not use drugs.   Family History:   family history includes Colon polyps in her mother; Heart attack in her maternal grandfather; Heart disease in an other family member; Hypertension in her father and mother; Kidney failure in her paternal grandfather; Migraines in her brother and mother; Pancreatic cancer in her maternal grandmother; Stroke in her mother.    Review of Systems: Review of Systems  Constitutional: Negative.   HENT: Negative.    Respiratory: Negative.    Cardiovascular:  Positive for palpitations.       Tachycardia  Gastrointestinal: Negative.   Musculoskeletal: Negative.   Neurological: Negative.   Psychiatric/Behavioral: Negative.    All other systems reviewed and are negative.  PHYSICAL EXAM: VS:  BP 110/80 (BP Location: Right Arm, Patient Position: Sitting, Cuff Size: Normal)   Pulse 86   Ht 5\' 7"  (1.702 m)   Wt 198 lb 8 oz (90 kg)   SpO2 98%   BMI 31.09 kg/m  , BMI Body mass index is 31.09 kg/m. GEN: Well nourished, well developed, in no acute distress HEENT:  normal Neck: no JVD, carotid bruits, or masses Cardiac: RRR; no murmurs, rubs, or gallops,no edema  Respiratory:  clear to auscultation bilaterally, normal work of breathing GI: soft, nontender, nondistended, + BS MS: no deformity or atrophy Skin: warm and dry, no rash Neuro:  Strength and sensation are intact Psych: euthymic mood, full affect  Recent Labs: 06/11/2023: BUN 11; Creatinine, Ser 0.68; Hemoglobin 14.1; Platelets 340; Potassium 4.0; Sodium 143    Lipid Panel No results found for: "CHOL", "HDL",  "LDLCALC", "TRIG"    Wt Readings from Last 3 Encounters:  06/17/23 198 lb 8 oz (90 kg)  06/11/23 197 lb (89.4 kg)  05/16/20 159 lb 12.8 oz (72.5 kg)       ASSESSMENT AND PLAN:  Problem List Items Addressed This Visit       Cardiology Problems   SVT (supraventricular tachycardia) (HCC) - Primary   Relevant Orders   EKG 12-Lead (Completed)   Paroxysmal tachycardia/SVT Documented on EKG in the emergency room Finally broke with repeat doses of adenosine  12 Does not appear to have had any triggers for her arrhythmia Recommend pill in the pocket approach, take diltiazem 60 mg as needed for recurrent SVT If it does not break, repeat dosing with adenosine  may be needed For recurrent episodes could consider referral to EP and consideration of SVT ablation Discussed Valsalva, carotid sinus massage Discussed avoiding heavy caffeine, stimulants  Patient seen in consultation for Dr. Shann Darnel and we referred back to his office for ongoing care of the issues detailed above  Signed, Juanda Noon, M.D., Ph.D. [facility] [place], [place] [phone]

## 2023-06-17 ENCOUNTER — Encounter: Payer: Self-pay | Admitting: Cardiovascular Disease

## 2023-06-17 ENCOUNTER — Ambulatory Visit: Attending: Cardiovascular Disease | Admitting: Cardiovascular Disease

## 2023-06-17 VITALS — BP 110/80 | HR 86 | Ht 67.0 in | Wt 198.5 lb

## 2023-06-17 DIAGNOSIS — I471 Supraventricular tachycardia, unspecified: Secondary | ICD-10-CM | POA: Diagnosis not present

## 2023-06-17 DIAGNOSIS — Z79899 Other long term (current) drug therapy: Secondary | ICD-10-CM | POA: Diagnosis not present

## 2023-06-17 MED ORDER — DILTIAZEM HCL 60 MG PO TABS: 60.0000 mg | ORAL_TABLET | Freq: Three times a day (TID) | ORAL | 3 refills | Status: AC | PRN

## 2023-06-17 NOTE — Patient Instructions (Addendum)
 Medication Instructions:  Diltiazem 60 mg as needed for rate >150, sustained tachycardia  If you need a refill on your cardiac medications before your next appointment, please call your pharmacy.   Lab work: TSH today  Testing/Procedures: Your physician has requested that you have an echocardiogram. Echocardiography is a painless test that uses sound waves to create images of your heart. It provides your doctor with information about the size and shape of your heart and how well your heart's chambers and valves are working.   You may receive an ultrasound enhancing agent through an IV if needed to better visualize your heart during the echo. This procedure takes approximately one hour.  There are no restrictions for this procedure.  This will take place at [address] [facility] [address] [facility], [place] [address]  Please note: We ask at that you not bring children with you during ultrasound (echo/ vascular) testing. Due to room size and safety concerns, children are not allowed in the ultrasound rooms during exams. Our front office staff cannot provide observation of children in our lobby area while testing is being conducted. An adult accompanying a patient to their appointment will only be allowed in the ultrasound room at the discretion of the ultrasound technician under special circumstances. We apologize for any inconvenience.   Follow-Up: At [facility], you and your health needs are our priority.  As part of our continuing mission to provide you with exceptional heart care, we have created designated Provider Care Teams.  These Care Teams include your primary Cardiologist (physician) and Advanced Practice Providers (APPs -  Physician Assistants and Nurse Practitioners) who all work together to provide you with the care you need, when you need it.  You will need a follow up appointment as needed  Providers on your designated Care Team:   Laneta Pintos, NP Varney Gentleman,  PA-C Cadence Gennaro Khat, [place]  COVID-19 Vaccine Information can be found at: [email or web address] For questions related to vaccine distribution or appointments, please email [email or web address] or call [phone].

## 2023-06-18 LAB — TSH: TSH: 1.25 u[IU]/mL (ref 0.450–4.500)

## 2023-07-31 ENCOUNTER — Ambulatory Visit: Attending: Cardiovascular Disease

## 2023-07-31 DIAGNOSIS — I471 Supraventricular tachycardia, unspecified: Secondary | ICD-10-CM

## 2023-07-31 LAB — ECHOCARDIOGRAM COMPLETE
AR max vel: 2.75 cm2
AV Area VTI: 2.91 cm2
AV Area mean vel: 2.63 cm2
AV Mean grad: 4 mmHg
AV Peak grad: 6.1 mmHg
Ao pk vel: 1.23 m/s
Area-P 1/2: 3.99 cm2
S' Lateral: 3.24 cm

## 2023-08-04 ENCOUNTER — Ambulatory Visit: Payer: Self-pay | Admitting: Cardiovascular Disease

## 2023-10-10 NOTE — Progress Notes (Signed)
 Obstetric Office Visit   Ms. Pagliarulo is a 23 y.o. female [id number] here for Amenorrhea  She presents today with her mother and husband for her amenorrhea/viability scan. Her mother reports her LMP was 08/31/2023. Based on LMP, EGA [redacted]w[redacted]d.   US : 10/10/2023 Single IUP seen. CRL = 8.6 mm = [redacted]w[redacted]d not c/w LMP No cardiac activity seen.  Cervical length: 5.3 cm Right ovary: corpus luteum 19 x 18 x 16 mm Left ovary: visualized   GYN Hx: - LMP: Patient's last menstrual period was 08/31/2023 (exact date). - Menses: Her menses are regular every 28-30 days. She does not have intermenstrual bleeding. - Menopause: NA  - Pap hx: n/a - STI hx: Denies any history of gonorrhea, chlamydia, herpes - Sexual preference: Sexually active - Contraceptive method: none - Abdominal surgeries: n/a    Exam:   BP 133/87   Pulse 89   Ht 170.2 cm (5' 7)   Wt 87.5 kg (193 lb)   LMP 08/31/2023 (Exact Date)   Breastfeeding Unknown   BMI 30.23 kg/m   Physical Exam Constitutional:      General: She is not in acute distress. HENT:     Head: Normocephalic.  Cardiovascular:     Rate and Rhythm: Normal rate and regular rhythm.  Pulmonary:     Effort: Pulmonary effort is normal.  Musculoskeletal:        General: Normal range of motion.  Neurological:     Mental Status: She is alert and oriented to person, place, and time.  Psychiatric:        Mood and Affect: Mood normal.        Behavior: Behavior normal.        Thought Content: Thought content normal.        Judgment: Judgment normal.     Impression:   The primary encounter diagnosis was Spontaneous miscarriage (HHS-HCC). A diagnosis of History of miscarriage was also pertinent to this visit.  Plan:   Spontaneous miscarriage: Causes of spontaneous miscarriage discussed with patient, including prevalence, common causes, and the expectation that this event does not increase the chance that she will miscarry again in the future. Emotional support given.  Discussed options including expectant management, medical management, and surgical management. Risks of expectant management include bleeding, unpredictability of timing and outcomes, and possibility of requiring eventual surgical management. Risks of medical management include bleeding, as well as possibility of requiring eventual surgical management should evacuation fail to be accomplished. Risks of surgical management include bleeding, infection, adjacent organ damage, anesthetic complications, possibility of future Asherman's syndrome. We discussed management including expectant, medical and surgical options.   Based on LMP [redacted]w[redacted]d and U/s on 10/10/2023 showed intrauterine pregnancy without cardiac activity. We discussed based on the imaging today, it is consistent with pregnancy failure.   After our discussion, Cinzia and her family has decided to think about treatment options. Discussed obtaining lab work today, patient declined. She will follow up with decision. Discussed to call or send message via mychart. Warning signs and symptoms discussed. All questions answered and emotional support given.   Return if symptoms worsen or fail to improve.   Attestation Statement:   I personally performed the service, non-incident to. (WP)   KATHERINE PHILLIPS, FNP [facility]

## 2023-10-18 ENCOUNTER — Encounter (HOSPITAL_COMMUNITY): Payer: Self-pay | Admitting: Emergency Medicine

## 2023-10-18 ENCOUNTER — Emergency Department (HOSPITAL_COMMUNITY)

## 2023-10-18 ENCOUNTER — Other Ambulatory Visit: Payer: Self-pay

## 2023-10-18 ENCOUNTER — Emergency Department (HOSPITAL_COMMUNITY)
Admission: EM | Admit: 2023-10-18 | Discharge: 2023-10-18 | Disposition: A | Discharge status: Home / Self Care | Attending: Emergency Medicine | Admitting: Emergency Medicine

## 2023-10-18 DIAGNOSIS — O4691 Antepartum hemorrhage, unspecified, first trimester: Secondary | ICD-10-CM | POA: Diagnosis not present

## 2023-10-18 DIAGNOSIS — Z3A01 Less than 8 weeks gestation of pregnancy: Secondary | ICD-10-CM | POA: Diagnosis not present

## 2023-10-18 DIAGNOSIS — N3001 Acute cystitis with hematuria: Secondary | ICD-10-CM | POA: Insufficient documentation

## 2023-10-18 DIAGNOSIS — O208 Other hemorrhage in early pregnancy: Secondary | ICD-10-CM

## 2023-10-18 DIAGNOSIS — O219 Vomiting of pregnancy, unspecified: Secondary | ICD-10-CM | POA: Diagnosis present

## 2023-10-18 DIAGNOSIS — O210 Mild hyperemesis gravidarum: Secondary | ICD-10-CM | POA: Insufficient documentation

## 2023-10-18 LAB — URINALYSIS, ROUTINE W REFLEX MICROSCOPIC
Bilirubin Urine: NEGATIVE
Glucose, UA: NEGATIVE mg/dL
Hgb urine dipstick: NEGATIVE
Ketones, ur: 80 mg/dL — AB
Nitrite: NEGATIVE
Protein, ur: 30 mg/dL — AB
Specific Gravity, Urine: 1.028 (ref 1.005–1.030)
pH: 5 (ref 5.0–8.0)

## 2023-10-18 LAB — CBC WITH DIFFERENTIAL/PLATELET
Abs Immature Granulocytes: 0.15 K/uL — ABNORMAL HIGH (ref 0.00–0.07)
Basophils Absolute: 0.1 K/uL (ref 0.0–0.1)
Basophils Relative: 0 %
Eosinophils Absolute: 0.1 K/uL (ref 0.0–0.5)
Eosinophils Relative: 1 %
HCT: 41.8 % (ref 36.0–46.0)
Hemoglobin: 14.6 g/dL (ref 12.0–15.0)
Immature Granulocytes: 1 %
Lymphocytes Relative: 23 %
Lymphs Abs: 3.2 K/uL (ref 0.7–4.0)
MCH: 31.9 pg (ref 26.0–34.0)
MCHC: 34.9 g/dL (ref 30.0–36.0)
MCV: 91.3 fL (ref 80.0–100.0)
Monocytes Absolute: 1 K/uL (ref 0.1–1.0)
Monocytes Relative: 7 %
Neutro Abs: 9.6 K/uL — ABNORMAL HIGH (ref 1.7–7.7)
Neutrophils Relative %: 68 %
Platelets: 323 K/uL (ref 150–400)
RBC: 4.58 MIL/uL (ref 3.87–5.11)
RDW: 11.9 % (ref 11.5–15.5)
WBC: 14.1 K/uL — ABNORMAL HIGH (ref 4.0–10.5)
nRBC: 0 % (ref 0.0–0.2)

## 2023-10-18 LAB — COMPREHENSIVE METABOLIC PANEL WITH GFR
ALT: 18 U/L (ref 0–44)
AST: 21 U/L (ref 15–41)
Albumin: 4 g/dL (ref 3.5–5.0)
Alkaline Phosphatase: 57 U/L (ref 38–126)
Anion gap: 14 (ref 5–15)
BUN: 12 mg/dL (ref 6–20)
CO2: 17 mmol/L — ABNORMAL LOW (ref 22–32)
Calcium: 9.4 mg/dL (ref 8.9–10.3)
Chloride: 106 mmol/L (ref 98–111)
Creatinine, Ser: 0.62 mg/dL (ref 0.44–1.00)
GFR, Estimated: >60 mL/min (ref >60)
Glucose, Bld: 80 mg/dL (ref 70–99)
Potassium: 3.9 mmol/L (ref 3.5–5.1)
Sodium: 137 mmol/L (ref 135–145)
Total Bilirubin: 0.8 mg/dL (ref 0.0–1.2)
Total Protein: 7.5 g/dL (ref 6.5–8.1)

## 2023-10-18 LAB — HCG, QUANTITATIVE, PREGNANCY: hCG, Beta Chain, Quant, S: 106511 m[IU]/mL — ABNORMAL HIGH (ref <5)

## 2023-10-18 LAB — MAGNESIUM: Magnesium: 1.9 mg/dL (ref 1.7–2.4)

## 2023-10-18 MED ORDER — METOCLOPRAMIDE HCL 5 MG/ML IJ SOLN
10.0000 mg | Freq: Once | INTRAMUSCULAR | Status: AC
  Administered 2023-10-18: 10 mg via INTRAVENOUS
  Filled 2023-10-18: qty 2

## 2023-10-18 MED ORDER — SODIUM CHLORIDE 0.9 % IV BOLUS
1000.0000 mL | Freq: Once | INTRAVENOUS | Status: AC
  Administered 2023-10-18: 1000 mL via INTRAVENOUS

## 2023-10-18 MED ORDER — METOCLOPRAMIDE HCL 10 MG PO TABS: 10.0000 mg | ORAL_TABLET | Freq: Four times a day (QID) | ORAL | 0 refills | Status: DC

## 2023-10-18 MED ORDER — DIPHENHYDRAMINE HCL 50 MG/ML IJ SOLN
25.0000 mg | Freq: Once | INTRAMUSCULAR | Status: AC
  Administered 2023-10-18: 25 mg via INTRAVENOUS
  Filled 2023-10-18: qty 1

## 2023-10-18 MED ORDER — CEPHALEXIN 500 MG PO CAPS
500.0000 mg | ORAL_CAPSULE | Freq: Once | ORAL | Status: AC
  Administered 2023-10-18: 500 mg via ORAL
  Filled 2023-10-18: qty 1

## 2023-10-18 MED ORDER — CEPHALEXIN 500 MG PO CAPS: 500.0000 mg | ORAL_CAPSULE | Freq: Three times a day (TID) | ORAL | 0 refills | Status: AC

## 2023-10-18 NOTE — ED Provider Notes (Signed)
  EMERGENCY DEPARTMENT AT [facility] Provider Note   CSN: [id number] Arrival date & time: 10/18/23  9194     Patient presents with: Emesis During Pregnancy   Shelly Andersen is a 23 y.o. female.  She has PMH of SVT.  She presents to the ER for nausea vomiting x 3 days.  States she is not able to keep anything down.  Denies abdominal pain, vaginal symptoms or urinary symptoms.  Reports she has had about 17 home pregnancy test that were positive.  LMP was 08/31/2023.  She followed up with [facility] clinic for her first appointment had ultrasound and was told no cardiac activity and likely miscarriage.  She states did not get labs, had entered the wrong LMP on the ultrasound machine but when she discussed with Dr. Mimi told her it was likely irrelevant and they felt this with their pregnancy.  She states they did not do any blood work or urine.  She still having breast tenderness, fatigue and still having increase in some smell and feels like she is still pregnant and is worried that the ultrasound was just too early to detect heartbeat.   HPI     Prior to Admission medications   Medication Sig Start Date End Date Taking? Authorizing Provider  cephALEXin  (KEFLEX ) 500 MG capsule Take 1 capsule (500 mg total) by mouth 3 (three) times daily for 5 days. 10/18/23 10/23/23 Yes Cedrica Brune A, PA-C  diltiazem  (CARDIZEM ) 60 MG tablet Take 1 tablet (60 mg total) by mouth 3 (three) times daily as needed. For heart rate greater then 150. 06/17/23  Yes Gollan, Timothy J, MD  metoCLOPramide  (REGLAN ) 10 MG tablet Take 1 tablet (10 mg total) by mouth every 6 (six) hours. 10/18/23  Yes Ekansh Sherk A, PA-C  Prenatal Vit-Fe Fumarate-FA (PRENATAL MULTIVITAMIN) TABS tablet Take 1 tablet by mouth daily at 12 noon.   Yes [provider]  Levonorgestrel-Eth Estradiol  (TWIRLA ) 120-30 MCG/24HR PTWK Place 1 patch onto the skin and leave in place for 7 days.  Change patch every 7 days for 21  days. Then go without a patch for 7 days.  Repeat every 28 days. Patient not taking: Reported on 06/17/2023 06/06/20   Gasper Nancyann BRAVO, MD    Allergies: Patient has no known allergies.    Review of Systems  Updated Vital Signs BP (!) 110/59   Pulse 67   Temp 98.2 F (36.8 C) (Oral)   Resp 18   Ht 5' 8 (1.727 m)   Wt 87.5 kg   LMP 08/31/2023   SpO2 97%   BMI 29.35 kg/m   Physical Exam Vitals and nursing note reviewed.  Constitutional:      General: She is not in acute distress.    Appearance: She is well-developed.  HENT:     Head: Normocephalic and atraumatic.     Mouth/Throat:     Mouth: Mucous membranes are moist.  Eyes:     Conjunctiva/sclera: Conjunctivae normal.  Cardiovascular:     Rate and Rhythm: Normal rate and regular rhythm.     Heart sounds: No murmur heard. Pulmonary:     Effort: Pulmonary effort is normal. No respiratory distress.     Breath sounds: Normal breath sounds.  Abdominal:     Palpations: Abdomen is soft.     Tenderness: There is no abdominal tenderness.  Musculoskeletal:        General: No swelling.     Cervical back: Neck supple.  Skin:  General: Skin is warm and dry.     Capillary Refill: Capillary refill takes less than 2 seconds.  Neurological:     General: No focal deficit present.     Mental Status: She is alert and oriented to person, place, and time.  Psychiatric:        Mood and Affect: Mood normal.     (all labs ordered are listed, but only abnormal results are displayed) Labs Reviewed  COMPREHENSIVE METABOLIC PANEL WITH GFR - Abnormal; Notable for the following components:      Result Value   CO2 17 (*)    All other components within normal limits  CBC WITH DIFFERENTIAL/PLATELET - Abnormal; Notable for the following components:   WBC 14.1 (*)    Neutro Abs 9.6 (*)    Abs Immature Granulocytes 0.15 (*)    All other components within normal limits  HCG, QUANTITATIVE, PREGNANCY - Abnormal; Notable for the following  components:   hCG, Beta Chain, Quant, S 106,511 (*)    All other components within normal limits  URINALYSIS, ROUTINE W REFLEX MICROSCOPIC - Abnormal; Notable for the following components:   APPearance HAZY (*)    Ketones, ur 80 (*)    Protein, ur 30 (*)    Leukocytes,Ua SMALL (*)    Bacteria, UA MANY (*)    All other components within normal limits  URINE CULTURE  MAGNESIUM    EKG: None  Radiology: US  OB LESS THAN 14 WEEKS WITH OB TRANSVAGINAL Result Date: 10/18/2023 EXAM: OBSTETRIC ULTRASOUND FIRST TRIMESTER TECHNIQUE: Transvaginal first trimester obstetric pelvic duplex ultrasound was performed with real-time imaging, color flow Doppler imaging, and spectral analysis. COMPARISON: None provided. CLINICAL HISTORY: Pelvic pain and nausea. FINDINGS: UTERUS: No focal myometrial mass. GESTATIONAL SAC(S): Single intrauterine gestational sac is present. A small subchorionic hemorrhage is present. YOLK SAC: Present EMBRYO(<11WK) /FETUS(>=11WK): Single embryo is present. CROWN RUMP LENGTH: 9.8 mm RATE OF CARDIAC ACTIVITY: Cardiac activity is present. Fetal heart rate is 127 beats per minute. RIGHT OVARY: Unremarkable. Normal arterial and venous flow. LEFT OVARY: Unremarkable. Normal arterial and venous flow. FREE FLUID: No free fluid. MEASUREMENTS ESTIMATED GESTATIONAL AGE BY CURRENT ULTRASOUND: 7 weeks and 0 days ESTIMATED GESTATIONAL AGE BY LMP/PRIOR ULTRASOUND: 6 weeks and 6 days ESTIMATED DUE DATE: 06/05/2024 IMPRESSION: 1. Single intrauterine gestational sac with yolk sac and embryo with cardiac activity, consistent with a 7-week 0-day gestation. Fetal heart rate is 127 beats per minute. 2. Small subchorionic hemorrhage. Electronically signed by: Lonni Necessary MD 10/18/2023 01:17 PM EDT RP Workstation: [id number]     Procedures   Medications Ordered in the ED  sodium chloride  0.9 % bolus 1,000 mL (0 mLs Intravenous Stopped 10/18/23 1037)  metoCLOPramide  (REGLAN ) injection 10 mg (10 mg  Intravenous Given 10/18/23 0932)  diphenhydrAMINE  (BENADRYL ) injection 25 mg (25 mg Intravenous Given 10/18/23 0931)  cephALEXin  (KEFLEX ) capsule 500 mg (500 mg Oral Given 10/18/23 1349)                                    Medical Decision Making This patient presents to the ED for concern of nausea and vomiting, this involves an extensive number of treatment options, and is a complaint that carries with it a high risk of complications and morbidity.  The differential diagnosis includes hyperemesis gravidarum, UTI, electrolyte disturbance, dehydration, other   Co morbidities that complicate the patient evaluation :   Positive home pregnancy test,  Additional history obtained:  Additional history obtained from EMR External records from outside source obtained and reviewed including recent OB visit notes and ultrasound report   Lab Tests:  I Ordered, and personally interpreted labs.  The pertinent results include: CBC with proptosis 14, CMP with CO2 17 likely from vomiting otherwise normal, magnesium is normal, hCG is 106, 511 UA with 80 ketones, proteinuria, small leukocytes, 16 red blood cells per high-power field, 2150 white blood cells per high-power field and many bacteria   Imaging Studies ordered:  I ordered imaging studies including ultrasound pelvis which shows 7-week gestation IUP with fetal heart rate 127 I independently visualized and interpreted imaging within scope of identifying emergent findings  I agree with the radiologist interpretation    Problem List / ED Course / Critical interventions / Medication management  Nausea and vomiting-resolved with IV fluids and Reglan , patient is feeling much better.  Tolerating p.o. now.  Will give Reglan  for home and also advised on OTC doxylamine and vitamin B6 which is safe in pregnancy. Patient was told previously at visit that that she had nonviable pregnancy but is still very much having pregnancy symptoms, she had not had an  hCG drawn with very elevated hCG today we repeated ultrasound which shows intrauterine pregnancy with positive cardiac activity and subchorionic hemorrhage that is small.  I discussed results with patient listed.  Does appear to be a viable pregnancy, she does not want to go back to the clinic she had seen previously and [facility]  Given information for follow-up.  Given the subchorionic hemorrhage I advised patient on pelvic rest until she can follow-up with OB, though she has not had any pelvic pain or vaginal bleeding.  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  Patient did have bacteriuria, she is not having symptoms but given that she is pregnant we will treat empirically with Keflex . I ordered medication including Reglan  for nausea Reevaluation of the patient after these medicines showed that the patient improved I have reviewed the patients home medicines and have made adjustments as needed       Amount and/or Complexity of Data Reviewed Labs: ordered. Radiology: ordered.  Risk Prescription drug management.       Final diagnoses:  Hyperemesis gravidarum  Acute cystitis with hematuria  Subchorionic hemorrhage in first trimester    ED Discharge Orders          Ordered    metoCLOPramide  (REGLAN ) 10 MG tablet  Every 6 hours        10/18/23 1343    cephALEXin  (KEFLEX ) 500 MG capsule  3 times daily        10/18/23 [address] 10/18/23 1406    Towana Ozell BROCKS, MD 10/18/23 1811

## 2023-10-18 NOTE — ED Notes (Signed)
 Pt/family received d/c paperwork at this time. After going over the paperwork any questions, comments, or concerns were answered to the best of this nurse's knowledge. The pt/family verbally acknowledged the teachings/instructions.

## 2023-10-18 NOTE — Discharge Instructions (Addendum)
 It was a pleasure taking care of you today.  You are seen for nausea and vomiting the setting of pregnancy.  Despite your ultrasound without cardiac activity recently your hCG level was very elevated today and ultrasound showed a 7-week 0-day pregnancy with fetal heart rate of 127 bpm.  You do have a small subchorionic hemorrhage in the ultrasound.  This increases your risk of having a miscarriage.  I would advise pelvic rest including avoiding any lifting or 10 pounds, inserting anything in your vagina until otherwise cleared by OB/GYN.  Please follow-up with OB/GYN closely.  I am prescribing Reglan  to use for nausea since that helped her symptoms here.  Drink plenty of fluids.  Eat small frequent meals, can also take over-the-counter doxylamine succinate (Unisom) and vitamin B6 over-the-counter for your nausea.  Take taking your prenatal vitamins.  Come back to the ER if you have any severe pain, heavy bleeding, persistent vomiting despite medicines.  Prescribed antibiotics for UTI today as well.  Make sure you take the full course.

## 2023-10-18 NOTE — ED Triage Notes (Signed)
 Pt to the ED with complaints of N/V for the past 3 days. Pt states she is unable to eat or drink.  Pt was at an OB appt last Thursday and was told she is 5 weeks and 5 days pregnant, but they did not think the pregnancy was viable,

## 2023-10-20 LAB — URINE CULTURE

## 2023-11-21 ENCOUNTER — Encounter: Payer: Self-pay | Admitting: *Deleted

## 2023-11-22 ENCOUNTER — Encounter: Payer: Self-pay | Admitting: Women's Health

## 2023-11-22 DIAGNOSIS — Z8679 Personal history of other diseases of the circulatory system: Secondary | ICD-10-CM | POA: Insufficient documentation

## 2023-11-22 DIAGNOSIS — Z3480 Encounter for supervision of other normal pregnancy, unspecified trimester: Secondary | ICD-10-CM | POA: Insufficient documentation

## 2023-11-25 ENCOUNTER — Other Ambulatory Visit: Payer: Self-pay | Admitting: Women's Health

## 2023-11-25 ENCOUNTER — Encounter: Payer: Self-pay | Admitting: Cardiovascular Disease

## 2023-11-25 ENCOUNTER — Encounter: Admitting: *Deleted

## 2023-11-25 ENCOUNTER — Telehealth: Payer: Self-pay | Admitting: Women's Health

## 2023-11-25 ENCOUNTER — Ambulatory Visit (INDEPENDENT_AMBULATORY_CARE_PROVIDER_SITE_OTHER): Admitting: Women's Health

## 2023-11-25 ENCOUNTER — Other Ambulatory Visit (HOSPITAL_COMMUNITY)
Admission: RE | Admit: 2023-11-25 | Discharge: 2023-11-25 | Disposition: A | Discharge status: Home / Self Care | Source: Ambulatory Visit | Attending: Women's Health | Admitting: Women's Health

## 2023-11-25 ENCOUNTER — Encounter: Payer: Self-pay | Admitting: Women's Health

## 2023-11-25 VITALS — BP 126/88 | HR 94 | Wt 186.0 lb

## 2023-11-25 DIAGNOSIS — O99411 Diseases of the circulatory system complicating pregnancy, first trimester: Secondary | ICD-10-CM | POA: Diagnosis not present

## 2023-11-25 DIAGNOSIS — Z01419 Encounter for gynecological examination (general) (routine) without abnormal findings: Secondary | ICD-10-CM | POA: Diagnosis present

## 2023-11-25 DIAGNOSIS — Z1151 Encounter for screening for human papillomavirus (HPV): Secondary | ICD-10-CM

## 2023-11-25 DIAGNOSIS — Z113 Encounter for screening for infections with a predominantly sexual mode of transmission: Secondary | ICD-10-CM | POA: Diagnosis not present

## 2023-11-25 DIAGNOSIS — Z131 Encounter for screening for diabetes mellitus: Secondary | ICD-10-CM | POA: Diagnosis present

## 2023-11-25 DIAGNOSIS — O210 Mild hyperemesis gravidarum: Secondary | ICD-10-CM | POA: Diagnosis not present

## 2023-11-25 DIAGNOSIS — Z124 Encounter for screening for malignant neoplasm of cervix: Secondary | ICD-10-CM | POA: Diagnosis not present

## 2023-11-25 DIAGNOSIS — Z3481 Encounter for supervision of other normal pregnancy, first trimester: Secondary | ICD-10-CM

## 2023-11-25 DIAGNOSIS — Z3480 Encounter for supervision of other normal pregnancy, unspecified trimester: Secondary | ICD-10-CM | POA: Insufficient documentation

## 2023-11-25 DIAGNOSIS — Z3A12 12 weeks gestation of pregnancy: Secondary | ICD-10-CM | POA: Insufficient documentation

## 2023-11-25 DIAGNOSIS — Z8679 Personal history of other diseases of the circulatory system: Secondary | ICD-10-CM

## 2023-11-25 DIAGNOSIS — I471 Supraventricular tachycardia, unspecified: Secondary | ICD-10-CM

## 2023-11-25 MED ORDER — DOXYLAMINE-PYRIDOXINE 10-10 MG PO TBEC: DELAYED_RELEASE_TABLET | ORAL | 6 refills | Status: DC

## 2023-11-25 MED ORDER — PROMETHAZINE HCL 25 MG PO TABS: 12.5000 mg | ORAL_TABLET | Freq: Four times a day (QID) | ORAL | 6 refills | Status: AC | PRN

## 2023-11-25 MED ORDER — BLOOD PRESSURE MONITOR MISC: 0 refills | Status: AC

## 2023-11-25 NOTE — Progress Notes (Signed)
 INITIAL OBSTETRICAL VISIT Patient name: Shelly Andersen MRN [id number]  Date of birth: [birth year] Chief Complaint:   Initial Prenatal Visit  History of Present Illness:   Shelly Andersen is a 23 y.o. G36P0010 Caucasian female at [redacted]w[redacted]d by LMP c/w u/s at 7 weeks with an Estimated Date of Delivery: 06/06/24 being seen today for her initial obstetrical visit.   Patient's last menstrual period was 08/31/2023. Her obstetrical history is significant for SAB x 1.   Today she reports n/v, requests meds. Currently on light duty at work d/t bleeding earlier in pregnancy-note given today to come off light duty.  H/O SVT, take cardizem  prn, has only taken once since pregnant, cardiologist Dr. Tallon, [facility], hasn't seen since pregnant Last pap never. Results were: N/A     11/25/2023    9:09 AM 05/16/2020   11:01 AM 08/24/2016    4:35 PM  Depression screen PHQ 2/9  Decreased Interest 0 0 0  Down, Depressed, Hopeless 0 0 0  PHQ - 2 Score 0 0 0  Altered sleeping 0 0 0  Tired, decreased energy 0 0 0  Change in appetite 0 0 0  Feeling bad or failure about yourself  0 0 0  Trouble concentrating 0 0 0  Moving slowly or fidgety/restless 0 0 0  Suicidal thoughts 0 0 0  PHQ-9 Score 0 0 0  Difficult doing work/chores  Not difficult at all         11/25/2023    9:09 AM  GAD 7 : Generalized Anxiety Score  Nervous, Anxious, on Edge 0  Control/stop worrying 0  Worry too much - different things 0  Trouble relaxing 0  Restless 0  Easily annoyed or irritable 0  Afraid - awful might happen 0  Total GAD 7 Score 0     Review of Systems:   Pertinent items are noted in HPI Denies cramping/contractions, leakage of fluid, vaginal bleeding, abnormal vaginal discharge w/ itching/odor/irritation, headaches, visual changes, shortness of breath, chest pain, abdominal pain, severe nausea/vomiting, or problems with urination or bowel movements unless otherwise stated above.  Pertinent History  Reviewed:  Reviewed past medical,surgical, social, obstetrical and family history.  Reviewed problem list, medications and allergies. OB History  Gravida Para Term Preterm AB Living  2 0 0 0 1   SAB IAB Ectopic Multiple Live Births  1 0 0      # Outcome Date GA Lbr Len/2nd Weight Sex Type Anes PTL Lv  2 Current           1 SAB 05/2023           Physical Assessment:   Vitals:   11/25/23 0911  BP: 126/88  Pulse: 94  Weight: 186 lb (84.4 kg)  Body mass index is 28.28 kg/m.       Physical Examination:  General appearance - well appearing, and in no distress  Mental status - alert, oriented to person, place, and time  Psych:  She has a normal mood and affect  Skin - warm and dry, normal color, no suspicious lesions noted  Chest - effort normal, all lung fields clear to auscultation bilaterally  Heart - normal rate and regular rhythm  Abdomen - soft, nontender  Extremities:  No swelling or varicosities noted  Pelvic - VULVA: normal appearing vulva with no masses, tenderness or lesions  VAGINA: normal appearing vagina with normal color and discharge, no lesions  CERVIX: normal appearing cervix without discharge or lesions,  no CMT  Thin prep pap is done w/ HR HPV cotesting  Chaperone: Winton Cherry  TODAY'S informal TA u/s: +FCA and active fetus  No results found for this or any previous visit (from the past 24 hours).  Assessment & Plan:  1) Low-Risk Pregnancy G2P0010 at [redacted]w[redacted]d with an Estimated Date of Delivery: 06/06/24   2) Initial OB visit  3) N/V> rx diclegis  4) Cervical cancer screen> pap today  5) SVT> on cardizem  prn from Dr. Tallon, cardiologist (hasn't seen since pregnant), pt to notify  him of pregnancy, see if needs to be seen  Meds:  Meds ordered this encounter  Medications   Blood Pressure Monitor MISC    Sig: For regular home bp monitoring during pregnancy    Dispense:  1 each    Refill:  0    Z34.81 Please mail to patient   Doxylamine-Pyridoxine  (DICLEGIS) 10-10 MG TBEC    Sig: 2 tabs q hs, if sx persist add 1 tab q am on day 3, if sx persist add 1 tab q afternoon on day 4    Dispense:  100 tablet    Refill:  6    Initial labs obtained Continue prenatal vitamins Reviewed n/v relief measures and warning s/s to report Reviewed recommended weight gain based on pre-gravid BMI Encouraged well-balanced diet Genetic & carrier screening discussed: requests Panorama and Horizon  Ultrasound discussed; fetal survey: requested CCNC completed> form faxed if has or is planning to apply for medicaid The nature of [facility] [employer] with multiple MDs and other Advanced Practice Providers was explained to patient; also emphasized that fellows, residents, and students are part of our team. Does not have home bp cuff. Office bp cuff given: yes. Rx sent: no. Check bp weekly, let us  know if consistently >140/90.   Follow-up: Return in about 4 weeks (around 12/23/2023) for LROB, MD or CNM, in person; then @ 20w for anatomy u/s and LROB .   Orders Placed This Encounter  Procedures   Urine Culture   CBC/D/Plt+RPR+Rh+ABO+RubIgG...   PANORAMA PRENATAL TEST   HORIZON CUSTOM   Hemoglobin A1c    Suzen JONELLE Fetters CNM, [facility] 11/25/2023 10:10 AM

## 2023-11-25 NOTE — Patient Instructions (Signed)
 Beola, thank you for choosing our office today! We appreciate the opportunity to meet your healthcare needs. You may receive a short survey by mail, e-mail, or through [employer]. If you are happy with your care we would appreciate if you could take just a few minutes to complete the survey questions. We read all of your comments and take your feedback very seriously. Thank you again for choosing our office.  Center for [employer] [facility] Team at [facility]  [facility] at [facility] ([address] [place], [place] [address]) Entrance C, located off of [address] Free 24/7 valet parking   Nausea & Vomiting Have saltine crackers or pretzels by your bed and eat a few bites before you raise your head out of bed in the morning Eat small frequent meals throughout the day instead of large meals Drink plenty of fluids throughout the day to stay hydrated, just don't drink a lot of fluids with your meals.  This can make your stomach fill up faster making you feel sick Do not brush your teeth right after you eat Products with real ginger are good for nausea, like ginger ale and ginger hard candy Make sure it says made with real ginger! Sucking on sour candy like lemon heads is also good for nausea If your prenatal vitamins make you nauseated, take them at night so you will sleep through the nausea Sea Bands If you feel like you need medicine for the nausea & vomiting please let us  know If you are unable to keep any fluids or food down please let us  know   Constipation Drink plenty of fluid, preferably water, throughout the day Eat foods high in fiber such as fruits, vegetables, and grains Exercise, such as walking, is a good way to keep your bowels regular Drink warm fluids, especially warm prune juice, or decaf coffee Eat a 1/2 cup of real oatmeal (not instant), 1/2 cup applesauce, and 1/2-1 cup warm prune juice every day If needed, you may take Colace (docusate sodium) stool softener  once or twice a day to help keep the stool soft.  If you still are having problems with constipation, you may take Miralax once daily as needed to help keep your bowels regular.   Home Blood Pressure Monitoring for Patients   Your provider has recommended that you check your blood pressure (BP) at least once a week at home. If you do not have a blood pressure cuff at home, one will be provided for you. Contact your provider if you have not received your monitor within 1 week.   Helpful Tips for Accurate Home Blood Pressure Checks  Don't smoke, exercise, or drink caffeine 30 minutes before checking your BP Use the restroom before checking your BP (a full bladder can raise your pressure) Relax in a comfortable upright chair Feet on the ground Left arm resting comfortably on a flat surface at the level of your heart Legs uncrossed Back supported Sit quietly and don't talk Place the cuff on your bare arm Adjust snuggly, so that only two fingertips can fit between your skin and the top of the cuff Check 2 readings separated by at least one minute Keep a log of your BP readings For a visual, please reference this diagram: http://ccnc.care/bpdiagram  Provider Name: [facility]     Phone: [phone]  Zone 1: ALL CLEAR  Continue to monitor your symptoms:  BP reading is less than 140 (top number) or less than 90 (bottom  number)  No right upper stomach pain No headaches or seeing spots No feeling nauseated or throwing up No swelling in face and hands  Zone 2: CAUTION Call your doctor's office for any of the following:  BP reading is greater than 140 (top number) or greater than 90 (bottom number)  Stomach pain under your ribs in the middle or right side Headaches or seeing spots Feeling nauseated or throwing up Swelling in face and hands  Zone 3: EMERGENCY  Seek immediate medical care if you have any of the following:  BP reading is greater than160 (top number) or greater than  110 (bottom number) Severe headaches not improving with Tylenol  Serious difficulty catching your breath Any worsening symptoms from Zone 2    First Trimester of Pregnancy The first trimester of pregnancy is from week 1 until the end of week 12 (months 1 through 3). A week after a sperm fertilizes an egg, the egg will implant on the wall of the uterus. This embryo will begin to develop into a baby. Genes from you and your partner are forming the baby. The female genes determine whether the baby is a boy or a girl. At 6-8 weeks, the eyes and face are formed, and the heartbeat can be seen on ultrasound. At the end of 12 weeks, all the baby's organs are formed.  Now that you are pregnant, you will want to do everything you can to have a healthy baby. Two of the most important things are to get good prenatal care and to follow your health care provider's instructions. Prenatal care is all the medical care you receive before the baby's birth. This care will help prevent, find, and treat any problems during the pregnancy and childbirth. BODY CHANGES Your body goes through many changes during pregnancy. The changes vary from woman to woman.  You may gain or lose a couple of pounds at first. You may feel sick to your stomach (nauseous) and throw up (vomit). If the vomiting is uncontrollable, call your health care provider. You may tire easily. You may develop headaches that can be relieved by medicines approved by your health care provider. You may urinate more often. Painful urination may mean you have a bladder infection. You may develop heartburn as a result of your pregnancy. You may develop constipation because certain hormones are causing the muscles that push waste through your intestines to slow down. You may develop hemorrhoids or swollen, bulging veins (varicose veins). Your breasts may begin to grow larger and become tender. Your nipples may stick out more, and the tissue that surrounds them  (areola) may become darker. Your gums may bleed and may be sensitive to brushing and flossing. Dark spots or blotches (chloasma, mask of pregnancy) may develop on your face. This will likely fade after the baby is born. Your menstrual periods will stop. You may have a loss of appetite. You may develop cravings for certain kinds of food. You may have changes in your emotions from day to day, such as being excited to be pregnant or being concerned that something may go wrong with the pregnancy and baby. You may have more vivid and strange dreams. You may have changes in your hair. These can include thickening of your hair, rapid growth, and changes in texture. Some women also have hair loss during or after pregnancy, or hair that feels dry or thin. Your hair will most likely return to normal after your baby is born. WHAT TO EXPECT AT YOUR PRENATAL  VISITS During a routine prenatal visit: You will be weighed to make sure you and the baby are growing normally. Your blood pressure will be taken. Your abdomen will be measured to track your baby's growth. The fetal heartbeat will be listened to starting around week 10 or 12 of your pregnancy. Test results from any previous visits will be discussed. Your health care provider may ask you: How you are feeling. If you are feeling the baby move. If you have had any abnormal symptoms, such as leaking fluid, bleeding, severe headaches, or abdominal cramping. If you have any questions. Other tests that may be performed during your first trimester include: Blood tests to find your blood type and to check for the presence of any previous infections. They will also be used to check for low iron levels (anemia) and Rh antibodies. Later in the pregnancy, blood tests for diabetes will be done along with other tests if problems develop. Urine tests to check for infections, diabetes, or protein in the urine. An ultrasound to confirm the proper growth and development  of the baby. An amniocentesis to check for possible genetic problems. Fetal screens for spina bifida and Down syndrome. You may need other tests to make sure you and the baby are doing well. HOME CARE INSTRUCTIONS  Medicines Follow your health care provider's instructions regarding medicine use. Specific medicines may be either safe or unsafe to take during pregnancy. Take your prenatal vitamins as directed. If you develop constipation, try taking a stool softener if your health care provider approves. Diet Eat regular, well-balanced meals. Choose a variety of foods, such as meat or vegetable-based protein, fish, milk and low-fat dairy products, vegetables, fruits, and whole grain breads and cereals. Your health care provider will help you determine the amount of weight gain that is right for you. Avoid raw meat and uncooked cheese. These carry germs that can cause birth defects in the baby. Eating four or five small meals rather than three large meals a day may help relieve nausea and vomiting. If you start to feel nauseous, eating a few soda crackers can be helpful. Drinking liquids between meals instead of during meals also seems to help nausea and vomiting. If you develop constipation, eat more high-fiber foods, such as fresh vegetables or fruit and whole grains. Drink enough fluids to keep your urine clear or pale yellow. Activity and Exercise Exercise only as directed by your health care provider. Exercising will help you: Control your weight. Stay in shape. Be prepared for labor and delivery. Experiencing pain or cramping in the lower abdomen or low back is a good sign that you should stop exercising. Check with your health care provider before continuing normal exercises. Try to avoid standing for long periods of time. Move your legs often if you must stand in one place for a long time. Avoid heavy lifting. Wear low-heeled shoes, and practice good posture. You may continue to have sex  unless your health care provider directs you otherwise. Relief of Pain or Discomfort Wear a good support bra for breast tenderness.   Take warm sitz baths to soothe any pain or discomfort caused by hemorrhoids. Use hemorrhoid cream if your health care provider approves.   Rest with your legs elevated if you have leg cramps or low back pain. If you develop varicose veins in your legs, wear support hose. Elevate your feet for 15 minutes, 3-4 times a day. Limit salt in your diet. Prenatal Care Schedule your prenatal visits by the  twelfth week of pregnancy. They are usually scheduled monthly at first, then more often in the last 2 months before delivery. Write down your questions. Take them to your prenatal visits. Keep all your prenatal visits as directed by your health care provider. Safety Wear your seat belt at all times when driving. Make a list of emergency phone numbers, including numbers for family, friends, the hospital, and police and fire departments. General Tips Ask your health care provider for a referral to a local prenatal education class. Begin classes no later than at the beginning of month 6 of your pregnancy. Ask for help if you have counseling or nutritional needs during pregnancy. Your health care provider can offer advice or refer you to specialists for help with various needs. Do not use hot tubs, steam rooms, or saunas. Do not douche or use tampons or scented sanitary pads. Do not cross your legs for long periods of time. Avoid cat litter boxes and soil used by cats. These carry germs that can cause birth defects in the baby and possibly loss of the fetus by miscarriage or stillbirth. Avoid all smoking, herbs, alcohol, and medicines not prescribed by your health care provider. Chemicals in these affect the formation and growth of the baby. Schedule a dentist appointment. At home, brush your teeth with a soft toothbrush and be gentle when you floss. SEEK MEDICAL CARE IF:   You have dizziness. You have mild pelvic cramps, pelvic pressure, or nagging pain in the abdominal area. You have persistent nausea, vomiting, or diarrhea. You have a bad smelling vaginal discharge. You have pain with urination. You notice increased swelling in your face, hands, legs, or ankles. SEEK IMMEDIATE MEDICAL CARE IF:  You have a fever. You are leaking fluid from your vagina. You have spotting or bleeding from your vagina. You have severe abdominal cramping or pain. You have rapid weight gain or loss. You vomit blood or material that looks like coffee grounds. You are exposed to [place] measles and have never had them. You are exposed to fifth disease or chickenpox. You develop a severe headache. You have shortness of breath. You have any kind of trauma, such as from a fall or a car accident. Document Released: 01/30/2001 Document Revised: 06/22/2013 Document Reviewed: 12/16/2012 [facility] Patient Information 2015 [employer], [place]. This information is not intended to replace advice given to you by your health care provider. Make sure you discuss any questions you have with your health care provider.

## 2023-11-25 NOTE — Telephone Encounter (Signed)
 Pt states her insurance does not cover Diclegis. Please send another medication to the pharmacy.

## 2023-11-26 ENCOUNTER — Encounter: Payer: Self-pay | Admitting: *Deleted

## 2023-11-27 LAB — CBC/D/PLT+RPR+RH+ABO+RUBIGG...
Antibody Screen: NEGATIVE
Basophils Absolute: 0 x10E3/uL (ref 0.0–0.2)
Basos: 0 %
EOS (ABSOLUTE): 0.1 x10E3/uL (ref 0.0–0.4)
Eos: 1 %
HCV Ab: NONREACTIVE
HIV Screen 4th Generation wRfx: NONREACTIVE
Hematocrit: 41.3 % (ref 34.0–46.6)
Hemoglobin: 14 g/dL (ref 11.1–15.9)
Hepatitis B Surface Ag: NEGATIVE
Immature Grans (Abs): 0 x10E3/uL (ref 0.0–0.1)
Immature Granulocytes: 0 %
Lymphocytes Absolute: 2.7 x10E3/uL (ref 0.7–3.1)
Lymphs: 27 %
MCH: 32 pg (ref 26.6–33.0)
MCHC: 33.9 g/dL (ref 31.5–35.7)
MCV: 95 fL (ref 79–97)
Monocytes Absolute: 0.7 x10E3/uL (ref 0.1–0.9)
Monocytes: 7 %
Neutrophils Absolute: 6.2 x10E3/uL (ref 1.4–7.0)
Neutrophils: 65 %
Platelets: 327 x10E3/uL (ref 150–450)
RBC: 4.37 x10E6/uL (ref 3.77–5.28)
RDW: 12.2 % (ref 11.7–15.4)
RPR Ser Ql: NONREACTIVE
Rh Factor: POSITIVE
Rubella Antibodies, IGG: 1.79 {index} (ref >0.99)
WBC: 9.7 x10E3/uL (ref 3.4–10.8)

## 2023-11-27 LAB — HCV INTERPRETATION

## 2023-11-27 LAB — HEMOGLOBIN A1C
Est. average glucose Bld gHb Est-mCnc: 94 mg/dL
Hgb A1c MFr Bld: 4.9 % (ref 4.8–5.6)

## 2023-11-27 LAB — URINE CULTURE

## 2023-11-28 ENCOUNTER — Encounter: Payer: Self-pay | Admitting: Women's Health

## 2023-11-28 ENCOUNTER — Ambulatory Visit: Payer: Self-pay | Admitting: Women's Health

## 2023-11-28 DIAGNOSIS — A749 Chlamydial infection, unspecified: Secondary | ICD-10-CM | POA: Insufficient documentation

## 2023-11-28 DIAGNOSIS — R87619 Unspecified abnormal cytological findings in specimens from cervix uteri: Secondary | ICD-10-CM | POA: Insufficient documentation

## 2023-11-28 DIAGNOSIS — Z3480 Encounter for supervision of other normal pregnancy, unspecified trimester: Secondary | ICD-10-CM

## 2023-11-28 LAB — CYTOLOGY - PAP
Chlamydia: POSITIVE — AB
Comment: NEGATIVE
Comment: NEGATIVE
Comment: NEGATIVE
Comment: NEGATIVE
Comment: NORMAL
Diagnosis: NEGATIVE
HPV 16: NEGATIVE
HPV 18 / 45: NEGATIVE
High risk HPV: POSITIVE — AB
Neisseria Gonorrhea: NEGATIVE

## 2023-11-28 MED ORDER — AZITHROMYCIN 500 MG PO TABS: 1000.0000 mg | ORAL_TABLET | Freq: Once | ORAL | 0 refills | Status: AC

## 2023-11-29 ENCOUNTER — Other Ambulatory Visit: Payer: Self-pay | Admitting: Adult Health

## 2023-12-04 LAB — PANORAMA PRENATAL TEST FULL PANEL:PANORAMA TEST PLUS 5 ADDITIONAL MICRODELETIONS: FETAL FRACTION: 9.7

## 2023-12-04 LAB — HORIZON CUSTOM: REPORT SUMMARY: NEGATIVE

## 2023-12-23 ENCOUNTER — Ambulatory Visit: Admitting: Women's Health

## 2023-12-23 ENCOUNTER — Encounter: Payer: Self-pay | Admitting: Women's Health

## 2023-12-23 VITALS — BP 135/86 | HR 90 | Wt 186.0 lb

## 2023-12-23 DIAGNOSIS — Z8619 Personal history of other infectious and parasitic diseases: Secondary | ICD-10-CM

## 2023-12-23 DIAGNOSIS — Z363 Encounter for antenatal screening for malformations: Secondary | ICD-10-CM | POA: Diagnosis not present

## 2023-12-23 DIAGNOSIS — Z3482 Encounter for supervision of other normal pregnancy, second trimester: Secondary | ICD-10-CM | POA: Diagnosis not present

## 2023-12-23 DIAGNOSIS — Z3A16 16 weeks gestation of pregnancy: Secondary | ICD-10-CM

## 2023-12-23 DIAGNOSIS — I471 Supraventricular tachycardia, unspecified: Secondary | ICD-10-CM

## 2023-12-23 DIAGNOSIS — A749 Chlamydial infection, unspecified: Secondary | ICD-10-CM

## 2023-12-23 DIAGNOSIS — Z3480 Encounter for supervision of other normal pregnancy, unspecified trimester: Secondary | ICD-10-CM

## 2023-12-23 DIAGNOSIS — Z09 Encounter for follow-up examination after completed treatment for conditions other than malignant neoplasm: Secondary | ICD-10-CM

## 2023-12-23 DIAGNOSIS — Z1379 Encounter for other screening for genetic and chromosomal anomalies: Secondary | ICD-10-CM

## 2023-12-23 NOTE — Progress Notes (Signed)
 LOW-RISK PREGNANCY VISIT Patient name: Shelly Andersen MRN [id number]  Date of birth: Jan 10, 2001 Chief Complaint:   Routine Prenatal Visit  History of Present Illness:   Shelly Andersen is a 23 y.o. G41P0010 female at [redacted]w[redacted]d with an Estimated Date of Delivery: 06/06/24 being seen today for ongoing management of a low-risk pregnancy.   Today she reports sciatica. Contractions: Not present. Vag. Bleeding: None.  Movement: Absent. denies leaking of fluid.     11/25/2023    9:09 AM 05/16/2020   11:01 AM 08/24/2016    4:35 PM  Depression screen PHQ 2/9  Decreased Interest 0 0 0  Down, Depressed, Hopeless 0 0 0  PHQ - 2 Score 0 0 0  Altered sleeping 0 0 0  Tired, decreased energy 0 0 0  Change in appetite 0 0 0  Feeling bad or failure about yourself  0 0 0  Trouble concentrating 0 0 0  Moving slowly or fidgety/restless 0 0 0  Suicidal thoughts 0 0 0  PHQ-9 Score 0 0 0  Difficult doing work/chores  Not difficult at all         11/25/2023    9:09 AM  GAD 7 : Generalized Anxiety Score  Nervous, Anxious, on Edge 0  Control/stop worrying 0  Worry too much - different things 0  Trouble relaxing 0  Restless 0  Easily annoyed or irritable 0  Afraid - awful might happen 0  Total GAD 7 Score 0      Review of Systems:   Pertinent items are noted in HPI Denies abnormal vaginal discharge w/ itching/odor/irritation, headaches, visual changes, shortness of breath, chest pain, abdominal pain, severe nausea/vomiting, or problems with urination or bowel movements unless otherwise stated above. Pertinent History Reviewed:  Reviewed past medical,surgical, social, obstetrical and family history.  Reviewed problem list, medications and allergies. Physical Assessment:   Vitals:   12/23/23 1352  BP: 135/86  Pulse: 90  Weight: 186 lb (84.4 kg)  Body mass index is 28.28 kg/m.        Physical Examination:   General appearance: Well appearing, and in no distress  Mental status: Alert,  oriented to person, place, and time  Skin: Warm & dry  Cardiovascular: Normal heart rate noted  Respiratory: Normal respiratory effort, no distress  Abdomen: Soft, gravid, nontender  Pelvic: Cervical exam deferred         Extremities:    Fetal Status: Fetal Heart Rate (bpm): 146   Movement: Absent    Chaperone: N/A No results found for this or any previous visit (from the past 24 hours).  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  Labs/procedures today: AFP  Plan:  Continue routine obstetrical care  Next visit: prefers will be in person for u/s    Reviewed: Preterm labor symptoms and general obstetric precautions including but not limited to vaginal bleeding, contractions, leaking of fluid and fetal movement were reviewed in detail with the patient.  All questions were answered. Does have home bp cuff. Office bp cuff given: not applicable. Check bp weekly, let us  know if consistently >140 and/or >90.  Follow-up: Return for As scheduled.  Future Appointments  Date Time Provider Department Center  01/21/2024  3:00 PM [facility] None  01/21/2024  3:50 PM Alante Weimann, Suzen SAUNDERS, CNM [facility]    Orders Placed This Encounter  Procedures   GC/Chlamydia Probe Amp   US  OB Comp + 14 Wk   AFP, Serum, Open Spina Bifida   Suzen SAUNDERS Fetters CNM, [facility] 12/23/2023 2:37 PM

## 2023-12-23 NOTE — Patient Instructions (Addendum)
 Shelly Andersen, thank you for choosing our office today! We appreciate the opportunity to meet your healthcare needs. You may receive a short survey by mail, e-mail, or through [employer]. If you are happy with your care we would appreciate if you could take just a few minutes to complete the survey questions. We read all of your comments and take your feedback very seriously. Thank you again for choosing our office.  Center for [employer] Team at [facility] at [facility] ([address] [place], [place] [address]) Entrance C, located off of [address] Free 24/7 valet parking  Go to [email or web address] to register for FREE online childbirth classes  Call the office [phone]) or go to [facility] if: You begin to severe cramping Your water breaks.  Sometimes it is a big gush of fluid, sometimes it is just a trickle that keeps getting your panties wet or running down your legs You have vaginal bleeding.  It is normal to have a small amount of spotting if your cervix was checked.   [facility] Pediatricians/Family Doctors  [facility]): [address] Luba BROCKS, [phone]           [facility]: [address], [phone]                [facility]): [address], [phone] (call to ask if accepting patients) [facility] Department: [address], [place], [phone]    [facility] Pediatricians/Family Doctors [facility]): [phone], [phone] [facility]: [address] [place], [phone] [facility]: [address], [phone]  [facility] Doctors  [facility]): [phone] [facility]: [address] [phone]   [facility] Doctors [facility]: [address] [phone]  [facility] Doctors  [facility]: [phone], [phone]  Home Blood Pressure Monitoring for Patients   Your provider has recommended that you check your blood pressure (BP) at least once a week at home. If you do not have a blood pressure cuff at home, one will be provided for you. Contact your provider if you have not received your monitor within 1 week.   Helpful Tips for Accurate Home Blood Pressure Checks  Don't smoke, exercise, or drink caffeine 30 minutes before checking your BP Use the restroom before checking your BP (a full bladder can raise your pressure) Relax in a comfortable upright chair Feet on the ground Left arm resting comfortably on a flat surface at the level of your heart Legs uncrossed Back supported Sit quietly and don't talk Place the cuff on your bare arm Adjust snuggly, so that only two fingertips can fit between your skin and the top of the cuff Check 2 readings separated by at least one minute Keep a log of your BP readings For a visual, please reference this diagram: http://ccnc.care/bpdiagram  Provider Name: [facility]     Phone: [phone]  Zone 1: ALL CLEAR  Continue to monitor your symptoms:  BP reading is less than 140 (top number) or less than 90 (bottom number)  No right upper stomach pain No headaches or seeing spots No feeling nauseated or throwing up No swelling in face and hands  Zone 2: CAUTION Call your doctor's office for any of the following:  BP reading is greater than 140 (top number) or greater than  90 (bottom number)  Stomach pain under your ribs in the middle or right side Headaches or seeing spots Feeling nauseated or throwing up Swelling in face and hands  Zone 3: EMERGENCY  Seek immediate medical care if you have any of the following:  BP reading is greater than160 (top number) or greater than 110 (bottom number) Severe headaches not improving with Tylenol  Serious difficulty catching your breath Any worsening symptoms from  Zone 2     Second Trimester of Pregnancy The second trimester is from week 14 through week 27 (months 4 through 6). The second trimester is often a time when you feel your best. Your body has adjusted to being pregnant, and you begin to feel better physically. Usually, morning sickness has lessened or quit completely, you may have more energy, and you may have an increase in appetite. The second trimester is also a time when the fetus is growing rapidly. At the end of the sixth month, the fetus is about 9 inches long and weighs about 1 pounds. You will likely begin to feel the baby move (quickening) between 16 and 20 weeks of pregnancy. Body changes during your second trimester Your body continues to go through many changes during your second trimester. The changes vary from woman to woman. Your weight will continue to increase. You will notice your lower abdomen bulging out. You may begin to get stretch marks on your hips, abdomen, and breasts. You may develop headaches that can be relieved by medicines. The medicines should be approved by your health care provider. You may urinate more often because the fetus is pressing on your bladder. You may develop or continue to have heartburn as a result of your pregnancy. You may develop constipation because certain hormones are causing the muscles that push waste through your intestines to slow down. You may develop hemorrhoids or swollen, bulging veins (varicose veins). You may have back pain. This is caused by: Weight gain. Pregnancy hormones that are relaxing the joints in your pelvis. A shift in weight and the muscles that support your balance. Your breasts will continue to grow and they will continue to become tender. Your gums may bleed and may be sensitive to brushing and flossing. Dark spots or blotches (chloasma, mask of pregnancy) may develop on your face. This will likely fade after the baby is born. A dark line from your belly button to  the pubic area (linea nigra) may appear. This will likely fade after the baby is born. You may have changes in your hair. These can include thickening of your hair, rapid growth, and changes in texture. Some women also have hair loss during or after pregnancy, or hair that feels dry or thin. Your hair will most likely return to normal after your baby is born.  What to expect at prenatal visits During a routine prenatal visit: You will be weighed to make sure you and the fetus are growing normally. Your blood pressure will be taken. Your abdomen will be measured to track your baby's growth. The fetal heartbeat will be listened to. Any test results from the previous visit will be discussed.  Your health care provider may ask you: How you are feeling. If you are feeling the baby move. If you have had any abnormal symptoms, such as leaking fluid, bleeding, severe headaches, or abdominal cramping. If you are using any tobacco products, including cigarettes, chewing tobacco, and electronic cigarettes. If you have any questions.  Other tests that may be performed during  your second trimester include: Blood tests that check for: Low iron levels (anemia). High blood sugar that affects pregnant women (gestational diabetes) between 55 and 28 weeks. Rh antibodies. This is to check for a protein on red blood cells (Rh factor). Urine tests to check for infections, diabetes, or protein in the urine. An ultrasound to confirm the proper growth and development of the baby. An amniocentesis to check for possible genetic problems. Fetal screens for spina bifida and Down syndrome. HIV (human immunodeficiency virus) testing. Routine prenatal testing includes screening for HIV, unless you choose not to have this test.  Follow these instructions at home: Medicines Follow your health care provider's instructions regarding medicine use. Specific medicines may be either safe or unsafe to take during  pregnancy. Take a prenatal vitamin that contains at least 600 micrograms (mcg) of folic acid. If you develop constipation, try taking a stool softener if your health care provider approves. Eating and drinking Eat a balanced diet that includes fresh fruits and vegetables, whole grains, good sources of protein such as meat, eggs, or tofu, and low-fat dairy. Your health care provider will help you determine the amount of weight gain that is right for you. Avoid raw meat and uncooked cheese. These carry germs that can cause birth defects in the baby. If you have low calcium intake from food, talk to your health care provider about whether you should take a daily calcium supplement. Limit foods that are high in fat and processed sugars, such as fried and sweet foods. To prevent constipation: Drink enough fluid to keep your urine clear or pale yellow. Eat foods that are high in fiber, such as fresh fruits and vegetables, whole grains, and beans. Activity Exercise only as directed by your health care provider. Most women can continue their usual exercise routine during pregnancy. Try to exercise for 30 minutes at least 5 days a week. Stop exercising if you experience uterine contractions. Avoid heavy lifting, wear low heel shoes, and practice good posture. A sexual relationship may be continued unless your health care provider directs you otherwise. Relieving pain and discomfort Wear a good support bra to prevent discomfort from breast tenderness. Take warm sitz baths to soothe any pain or discomfort caused by hemorrhoids. Use hemorrhoid cream if your health care provider approves. Rest with your legs elevated if you have leg cramps or low back pain. If you develop varicose veins, wear support hose. Elevate your feet for 15 minutes, 3-4 times a day. Limit salt in your diet. Prenatal Care Write down your questions. Take them to your prenatal visits. Keep all your prenatal visits as told by your health  care provider. This is important. Safety Wear your seat belt at all times when driving. Make a list of emergency phone numbers, including numbers for family, friends, the hospital, and police and fire departments. General instructions Ask your health care provider for a referral to a local prenatal education class. Begin classes no later than the beginning of month 6 of your pregnancy. Ask for help if you have counseling or nutritional needs during pregnancy. Your health care provider can offer advice or refer you to specialists for help with various needs. Do not use hot tubs, steam rooms, or saunas. Do not douche or use tampons or scented sanitary pads. Do not cross your legs for long periods of time. Avoid cat litter boxes and soil used by cats. These carry germs that can cause birth defects in the baby and possibly loss of the  fetus by miscarriage or stillbirth. Avoid all smoking, herbs, alcohol, and unprescribed drugs. Chemicals in these products can affect the formation and growth of the baby. Do not use any products that contain nicotine or tobacco, such as cigarettes and e-cigarettes. If you need help quitting, ask your health care provider. Visit your dentist if you have not gone yet during your pregnancy. Use a soft toothbrush to brush your teeth and be gentle when you floss. Contact a health care provider if: You have dizziness. You have mild pelvic cramps, pelvic pressure, or nagging pain in the abdominal area. You have persistent nausea, vomiting, or diarrhea. You have a bad smelling vaginal discharge. You have pain when you urinate. Get help right away if: You have a fever. You are leaking fluid from your vagina. You have spotting or bleeding from your vagina. You have severe abdominal cramping or pain. You have rapid weight gain or weight loss. You have shortness of breath with chest pain. You notice sudden or extreme swelling of your face, hands, ankles, feet, or legs. You  have not felt your baby move in over an hour. You have severe headaches that do not go away when you take medicine. You have vision changes. Summary The second trimester is from week 14 through week 27 (months 4 through 6). It is also a time when the fetus is growing rapidly. Your body goes through many changes during pregnancy. The changes vary from woman to woman. Avoid all smoking, herbs, alcohol, and unprescribed drugs. These chemicals affect the formation and growth your baby. Do not use any tobacco products, such as cigarettes, chewing tobacco, and e-cigarettes. If you need help quitting, ask your health care provider. Contact your health care provider if you have any questions. Keep all prenatal visits as told by your health care provider. This is important. This information is not intended to replace advice given to you by your health care provider. Make sure you discuss any questions you have with your health care provider. Document Released: 01/30/2001 Document Revised: 07/14/2015 Document Reviewed: 04/08/2012 Elsevier Interactive Patient Education  2017 [employer]   Sciatica Rehab Ask your health care provider which exercises are safe for you. Do exercises exactly as told by your health care provider and adjust them as directed. It is normal to feel mild stretching, pulling, tightness, or discomfort as you do these exercises. Stop right away if you feel sudden pain or your pain gets worse. Do not begin these exercises until told by your health care provider. Stretching and range-of-motion exercises These exercises warm up your muscles and joints and improve the movement and flexibility of your hips and back. These exercises also help to relieve pain, numbness, and tingling. Sciatic nerve glide  Sit in a chair with your head facing down toward your chest. Place your hands behind your back. Let your shoulders slump forward. Slowly straighten one of your legs while you tilt your head  back as if you are looking toward the ceiling. Only straighten your leg as far as you can without making your symptoms worse. Hold this position for __________ seconds. Slowly return your leg and head back to the starting position. Repeat with your other leg. Repeat __________ times. Complete this exercise __________ times a day. Knee to chest with hip adduction and internal rotation  Lie on your back on a firm surface with both legs straight. Bend one of your knees and move it up toward your chest until you feel a gentle stretch in  your lower back and buttock. Then, move your knee toward the shoulder that is on the opposite side from your leg. This is hip adduction and internal rotation. Hold your leg in this position by holding on to the front of your knee. Hold this position for __________ seconds. Slowly return to the starting position. Repeat with your other leg. Repeat __________ times. Complete this exercise __________ times a day. Prone extension on elbows  Lie on your abdomen on a firm surface. A bed may be too soft for this exercise. Prop yourself up on your elbows. Use your arms to help lift your chest up until you feel a gentle stretch in your abdomen and your lower back. This will place some of your body weight on your elbows. If this is uncomfortable, try stacking pillows under your chest. Your hips should stay down, against the surface that you are lying on. Keep your hip and back muscles relaxed. Hold this position for __________ seconds. Slowly relax your upper body and return to the starting position. Repeat __________ times. Complete this exercise __________ times a day. Strengthening exercises These exercises build strength and endurance in your back. Endurance is the ability to use your muscles for a long time, even after they get tired. Pelvic tilt This exercise strengthens the muscles that lie deep in the abdomen. Lie on your back on a firm surface. Bend your knees  and keep your feet flat on the surface. Tense your abdominal muscles. Tip your pelvis up toward the ceiling and flatten your lower back into the firm surface. To help with this exercise, you may place a small towel under your lower back and try to push your back into the towel. Hold this position for __________ seconds. Let your muscles relax completely before you repeat this exercise. Repeat __________ times. Complete this exercise __________ times a day. Alternating arm and leg raises  Get on your hands and knees on a firm surface. If you are on a hard floor, you may want to use padding, such as an exercise mat, to cushion your knees. Line up your arms and legs. Your hands should be directly below your shoulders, and your knees should be directly below your hips. Lift your left leg behind you. At the same time, raise your right arm and straighten it in front of you. Do not lift your leg higher than your hip. Do not lift your arm higher than your shoulder. Keep your abdominal and back muscles tight. Keep your hips facing the ground. Do not arch your back. Keep your balance carefully, and do not hold your breath. Hold this position for __________ seconds. Slowly return to the starting position. Repeat with your right leg and your left arm. Repeat __________ times. Complete this exercise __________ times a day. Posture and body mechanics Good posture and healthy body mechanics can help to relieve stress in your body's tissues and joints. Body mechanics refers to the movements and positions of your body while you do your daily activities. Posture is part of body mechanics. Good posture means: Your spine is in its natural S-curve position (neutral). Your shoulders are pulled back slightly. Your head is not tipped forward. Follow these guidelines to improve your posture and body mechanics in your everyday activities. Standing  When standing, keep your spine neutral and your feet about hip  width apart. Keep a slight bend in your knees. Your ears, shoulders, and hips should line up. When you do a task in which you stand  in one place for a long time, place one foot up on a stable object that is 2-4 inches (5-10 cm) high, such as a footstool. This helps keep your spine neutral. Sitting  When sitting, keep your spine neutral and keep your feet flat on the floor. Use a footrest, if necessary, and keep your thighs parallel to the floor. Avoid rounding your shoulders, and avoid tilting your head forward. When working at a desk or a computer, keep your desk at a height where your hands are slightly lower than your elbows. Slide your chair under your desk so you are close enough to maintain good posture. When working at a computer, place your monitor at a height where you are looking straight ahead and you do not have to tilt your head forward or downward to look at the screen. Resting  When lying down and resting, avoid positions that are most painful for you. If you have pain with activities such as sitting, bending, stooping, or squatting, lie in a position in which your body does not bend very much. For example, avoid curling up on your side with your arms and knees near your chest (fetal position). If you have pain with activities such as standing for a long time or reaching with your arms, lie with your spine in a neutral position and bend your knees slightly. Try the following positions: Lying on your side with a pillow between your knees. Lying on your back with a pillow under your knees. Lifting  When lifting objects, keep your feet at least shoulder width apart and tighten your abdominal muscles. Bend your knees and hips and keep your spine neutral. It is important to lift using the strength of your legs, not your back. Do not lock your knees straight out. Always ask for help to lift heavy or awkward objects. This information is not intended to replace advice given to you by your  health care provider. Make sure you discuss any questions you have with your health care provider. Document Revised: 05/16/2021 Document Reviewed: 05/16/2021 Elsevier Patient Education  2024 [employer].

## 2023-12-25 LAB — GC/CHLAMYDIA PROBE AMP
Chlamydia trachomatis, NAA: NEGATIVE
Neisseria Gonorrhoeae by PCR: NEGATIVE

## 2023-12-27 ENCOUNTER — Encounter: Payer: Self-pay | Admitting: Women's Health

## 2023-12-30 ENCOUNTER — Ambulatory Visit: Payer: Self-pay | Admitting: Women's Health

## 2023-12-30 DIAGNOSIS — Z3480 Encounter for supervision of other normal pregnancy, unspecified trimester: Secondary | ICD-10-CM

## 2023-12-30 LAB — AFP, SERUM, OPEN SPINA BIFIDA
AFP MoM: 1.2
AFP Value: 37.5 ng/mL
Gest. Age on Collection Date: 16.7 wk
Maternal Age At EDD: 25.6 a
OSBR Risk 1 IN: 6499
Weight: 186 [lb_av]

## 2024-01-01 ENCOUNTER — Ambulatory Visit: Admitting: Advanced Practice Midwife

## 2024-01-01 VITALS — BP 121/83 | HR 76 | Wt 185.0 lb

## 2024-01-01 DIAGNOSIS — F419 Anxiety disorder, unspecified: Secondary | ICD-10-CM | POA: Diagnosis not present

## 2024-01-01 DIAGNOSIS — Z3A17 17 weeks gestation of pregnancy: Secondary | ICD-10-CM

## 2024-01-01 DIAGNOSIS — O99342 Other mental disorders complicating pregnancy, second trimester: Secondary | ICD-10-CM

## 2024-01-01 DIAGNOSIS — Z3480 Encounter for supervision of other normal pregnancy, unspecified trimester: Secondary | ICD-10-CM

## 2024-01-01 NOTE — Patient Instructions (Signed)
 Shelly Andersen, thank you for choosing our office today! We appreciate the opportunity to meet your healthcare needs. You may receive a short survey by mail, e-mail, or through [employer]. If you are happy with your care we would appreciate if you could take just a few minutes to complete the survey questions. We read all of your comments and take your feedback very seriously. Thank you again for choosing our office.  Center for [employer] Team at [facility] at [facility] ([address] [place], [place] [address]) Entrance C, located off of [address] Free 24/7 valet parking  Go to [email or web address] to register for FREE online childbirth classes  Call the office [phone]) or go to [facility] if: You begin to severe cramping Your water breaks.  Sometimes it is a big gush of fluid, sometimes it is just a trickle that keeps getting your panties wet or running down your legs You have vaginal bleeding.  It is normal to have a small amount of spotting if your cervix was checked.   [facility] Pediatricians/Family Doctors [facility]): [address] Luba BROCKS, [phone]           [facility]: [address], [phone]                [facility] Medicine [facility]): [address], [phone] (call to ask if accepting patients) [facility] Department: [address], [place], [phone]    [facility] Pediatricians/Family Doctors [facility]): [phone] [address], [phone] [facility]: [address] [place], [phone] [facility]: [address], [phone]  [facility] Doctors  [facility]): [phone] [facility]: [address], [phone]   [facility] Doctors [facility]: [address], [phone]  [facility] Doctors  [facility]: [phone], [phone]  Home Blood Pressure Monitoring for Patients   Your provider has recommended that you check your blood pressure (BP) at least once a week at home. If you do not have a blood pressure cuff at home, one will be provided for you. Contact your provider if you have not received your monitor within 1 week.   Helpful Tips for Accurate Home Blood Pressure Checks  Don't smoke, exercise, or drink caffeine 30 minutes before checking your BP Use the restroom before checking your BP (a full bladder can raise your pressure) Relax in a comfortable upright chair Feet on the ground Left arm resting comfortably on a flat surface at the level of your heart Legs uncrossed Back supported Sit quietly and don't talk Place the cuff on your bare arm Adjust snuggly, so that only two fingertips can fit between your skin and the top of the cuff Check 2 readings separated by at least one minute Keep a log of your BP readings For a visual, please reference this diagram: http://ccnc.care/bpdiagram  Provider Name: [facility]     Phone: [phone]  Zone 1: ALL CLEAR  Continue to monitor your symptoms:  BP reading is less than 140 (top number) or less than 90 (bottom number)  No right upper stomach pain No headaches or seeing spots No feeling nauseated or throwing up No swelling in face and hands  Zone 2: CAUTION Call your doctor's office for any of the following:  BP reading is greater than 140 (top number) or greater than  90 (bottom number)  Stomach pain under your ribs in the middle or right side Headaches or seeing spots Feeling nauseated or throwing up Swelling in face and hands  Zone 3: EMERGENCY  Seek immediate medical care if you have any of the following:  BP reading is greater than160 (top number) or greater than 110 (bottom number) Severe headaches not improving with Tylenol  Serious difficulty catching your breath Any worsening symptoms from  Zone 2     Second Trimester of Pregnancy The second trimester is from week 14 through week 27 (months 4 through 6). The second trimester is often a time when you feel your best. Your body has adjusted to being pregnant, and you begin to feel better physically. Usually, morning sickness has lessened or quit completely, you may have more energy, and you may have an increase in appetite. The second trimester is also a time when the fetus is growing rapidly. At the end of the sixth month, the fetus is about 9 inches long and weighs about 1 pounds. You will likely begin to feel the baby move (quickening) between 16 and 20 weeks of pregnancy. Body changes during your second trimester Your body continues to go through many changes during your second trimester. The changes vary from woman to woman. Your weight will continue to increase. You will notice your lower abdomen bulging out. You may begin to get stretch marks on your hips, abdomen, and breasts. You may develop headaches that can be relieved by medicines. The medicines should be approved by your health care provider. You may urinate more often because the fetus is pressing on your bladder. You may develop or continue to have heartburn as a result of your pregnancy. You may develop constipation because certain hormones are causing the muscles that push waste through your intestines to slow down. You may develop hemorrhoids or swollen, bulging veins (varicose veins). You may have back pain. This is caused by: Weight gain. Pregnancy hormones that are relaxing the joints in your pelvis. A shift in weight and the muscles that support your balance. Your breasts will continue to grow and they will continue to become tender. Your gums may bleed and may be sensitive to brushing and flossing. Dark spots or blotches (chloasma, mask of pregnancy) may develop on your face. This will likely fade after the baby is born. A dark line from your belly button to  the pubic area (linea nigra) may appear. This will likely fade after the baby is born. You may have changes in your hair. These can include thickening of your hair, rapid growth, and changes in texture. Some women also have hair loss during or after pregnancy, or hair that feels dry or thin. Your hair will most likely return to normal after your baby is born.  What to expect at prenatal visits During a routine prenatal visit: You will be weighed to make sure you and the fetus are growing normally. Your blood pressure will be taken. Your abdomen will be measured to track your baby's growth. The fetal heartbeat will be listened to. Any test results from the previous visit will be discussed.  Your health care provider may ask you: How you are feeling. If you are feeling the baby move. If you have had any abnormal symptoms, such as leaking fluid, bleeding, severe headaches, or abdominal cramping. If you are using any tobacco products, including cigarettes, chewing tobacco, and electronic cigarettes. If you have any questions.  Other tests that may be performed during  your second trimester include: Blood tests that check for: Low iron levels (anemia). High blood sugar that affects pregnant women (gestational diabetes) between 84 and 28 weeks. Rh antibodies. This is to check for a protein on red blood cells (Rh factor). Urine tests to check for infections, diabetes, or protein in the urine. An ultrasound to confirm the proper growth and development of the baby. An amniocentesis to check for possible genetic problems. Fetal screens for spina bifida and Down syndrome. HIV (human immunodeficiency virus) testing. Routine prenatal testing includes screening for HIV, unless you choose not to have this test.  Follow these instructions at home: Medicines Follow your health care provider's instructions regarding medicine use. Specific medicines may be either safe or unsafe to take during  pregnancy. Take a prenatal vitamin that contains at least 600 micrograms (mcg) of folic acid. If you develop constipation, try taking a stool softener if your health care provider approves. Eating and drinking Eat a balanced diet that includes fresh fruits and vegetables, whole grains, good sources of protein such as meat, eggs, or tofu, and low-fat dairy. Your health care provider will help you determine the amount of weight gain that is right for you. Avoid raw meat and uncooked cheese. These carry germs that can cause birth defects in the baby. If you have low calcium intake from food, talk to your health care provider about whether you should take a daily calcium supplement. Limit foods that are high in fat and processed sugars, such as fried and sweet foods. To prevent constipation: Drink enough fluid to keep your urine clear or pale yellow. Eat foods that are high in fiber, such as fresh fruits and vegetables, whole grains, and beans. Activity Exercise only as directed by your health care provider. Most women can continue their usual exercise routine during pregnancy. Try to exercise for 30 minutes at least 5 days a week. Stop exercising if you experience uterine contractions. Avoid heavy lifting, wear low heel shoes, and practice good posture. A sexual relationship may be continued unless your health care provider directs you otherwise. Relieving pain and discomfort Wear a good support bra to prevent discomfort from breast tenderness. Take warm sitz baths to soothe any pain or discomfort caused by hemorrhoids. Use hemorrhoid cream if your health care provider approves. Rest with your legs elevated if you have leg cramps or low back pain. If you develop varicose veins, wear support hose. Elevate your feet for 15 minutes, 3-4 times a day. Limit salt in your diet. Prenatal Care Write down your questions. Take them to your prenatal visits. Keep all your prenatal visits as told by your health  care provider. This is important. Safety Wear your seat belt at all times when driving. Make a list of emergency phone numbers, including numbers for family, friends, the hospital, and police and fire departments. General instructions Ask your health care provider for a referral to a local prenatal education class. Begin classes no later than the beginning of month 6 of your pregnancy. Ask for help if you have counseling or nutritional needs during pregnancy. Your health care provider can offer advice or refer you to specialists for help with various needs. Do not use hot tubs, steam rooms, or saunas. Do not douche or use tampons or scented sanitary pads. Do not cross your legs for long periods of time. Avoid cat litter boxes and soil used by cats. These carry germs that can cause birth defects in the baby and possibly loss of the  fetus by miscarriage or stillbirth. Avoid all smoking, herbs, alcohol, and unprescribed drugs. Chemicals in these products can affect the formation and growth of the baby. Do not use any products that contain nicotine or tobacco, such as cigarettes and e-cigarettes. If you need help quitting, ask your health care provider. Visit your dentist if you have not gone yet during your pregnancy. Use a soft toothbrush to brush your teeth and be gentle when you floss. Contact a health care provider if: You have dizziness. You have mild pelvic cramps, pelvic pressure, or nagging pain in the abdominal area. You have persistent nausea, vomiting, or diarrhea. You have a bad smelling vaginal discharge. You have pain when you urinate. Get help right away if: You have a fever. You are leaking fluid from your vagina. You have spotting or bleeding from your vagina. You have severe abdominal cramping or pain. You have rapid weight gain or weight loss. You have shortness of breath with chest pain. You notice sudden or extreme swelling of your face, hands, ankles, feet, or legs. You  have not felt your baby move in over an hour. You have severe headaches that do not go away when you take medicine. You have vision changes. Summary The second trimester is from week 14 through week 27 (months 4 through 6). It is also a time when the fetus is growing rapidly. Your body goes through many changes during pregnancy. The changes vary from woman to woman. Avoid all smoking, herbs, alcohol, and unprescribed drugs. These chemicals affect the formation and growth your baby. Do not use any tobacco products, such as cigarettes, chewing tobacco, and e-cigarettes. If you need help quitting, ask your health care provider. Contact your health care provider if you have any questions. Keep all prenatal visits as told by your health care provider. This is important. This information is not intended to replace advice given to you by your health care provider. Make sure you discuss any questions you have with your health care provider. Document Released: 01/30/2001 Document Revised: 07/14/2015 Document Reviewed: 04/08/2012 Elsevier Interactive Patient Education  2017 [employer].

## 2024-01-01 NOTE — Progress Notes (Signed)
   LOW-RISK PREGNANCY VISIT Patient name: Shelly Andersen MRN [id number]  Date of birth: [birth year] Chief Complaint:   Routine Prenatal Visit (Anxiety )  History of Present Illness:   Shelly Andersen is a 23 y.o. G42P0010 female at [redacted]w[redacted]d with an Estimated Date of Delivery: 06/06/24 being seen today for ongoing management of a low-risk pregnancy.  Today she reports increasing anxiety; hasn't had therapy for this before or taken meds. Contractions: Not present. Vag. Bleeding: None.  Movement: Present. denies leaking of fluid. Review of Systems:   Pertinent items are noted in HPI Denies abnormal vaginal discharge w/ itching/odor/irritation, headaches, visual changes, shortness of breath, chest pain, abdominal pain, severe nausea/vomiting, or problems with urination or bowel movements unless otherwise stated above. Pertinent History Reviewed:  Reviewed past medical,surgical, social, obstetrical and family history.  Reviewed problem list, medications and allergies. Physical Assessment:   Vitals:   01/01/24 1106  BP: 121/83  Pulse: 76  Weight: 185 lb (83.9 kg)  Body mass index is 28.13 kg/m.        Physical Examination:   General appearance: Well appearing, and in no distress  Mental status: Alert, oriented to person, place, and time  Skin: Warm & dry  Cardiovascular: Normal heart rate noted  Respiratory: Normal respiratory effort, no distress  Abdomen: Soft, gravid, nontender  Pelvic: Cervical exam deferred         Extremities:    Fetal Status: Fetal Heart Rate (bpm): 162   Movement: Present    No results found for this or any previous visit (from the past 24 hours).  Assessment & Plan:  1) Low-risk pregnancy G2P0010 at [redacted]w[redacted]d with an Estimated Date of Delivery: 06/06/24   2) Increasing anxiety, had a rough start to the pregnancy due to being given 'nonviable' u/s results at [facility] in first trimester and was offered medical and surgical options; has also been using [employer] for  information; recommended cutting down time on [employer]/social media for the sake of her mental health; also open to meeting with [facility] (ordered); looking forward to anatomy u/s   Meds: No orders of the defined types were placed in this encounter.  Labs/procedures today: none  Plan:  Continue routine obstetrical care   Reviewed: Preterm labor symptoms and general obstetric precautions including but not limited to vaginal bleeding, contractions, leaking of fluid and fetal movement were reviewed in detail with the patient.  All questions were answered. Has home bp cuff. Check bp weekly, let us  know if >140/90.   Follow-up: Return for As scheduled. (Anatomy u/s)  Orders Placed This Encounter  Procedures   Ambulatory referral to Integrated Behavioral Health   Suzen JONETTA Gentry [facility] 01/01/2024 11:59 AM

## 2024-01-21 ENCOUNTER — Encounter: Payer: Self-pay | Admitting: Women's Health

## 2024-01-21 ENCOUNTER — Ambulatory Visit: Admitting: Radiology

## 2024-01-21 ENCOUNTER — Ambulatory Visit: Admitting: Women's Health

## 2024-01-21 VITALS — BP 132/87 | HR 115 | Wt 190.0 lb

## 2024-01-21 DIAGNOSIS — Z3482 Encounter for supervision of other normal pregnancy, second trimester: Secondary | ICD-10-CM

## 2024-01-21 DIAGNOSIS — Z363 Encounter for antenatal screening for malformations: Secondary | ICD-10-CM

## 2024-01-21 DIAGNOSIS — Z3A20 20 weeks gestation of pregnancy: Secondary | ICD-10-CM

## 2024-01-21 DIAGNOSIS — Z3480 Encounter for supervision of other normal pregnancy, unspecified trimester: Secondary | ICD-10-CM

## 2024-01-21 NOTE — Progress Notes (Signed)
 US : GA = 20+3 weeks Single active female fetus, cephalic, FHR = 147 bpm, anterior pl, gr1, SVP = 5.3 cm, EFW 377g, 63%, CL = 5.2 cm, nl ov's, Anatomy screen completed, no apparent abn

## 2024-01-21 NOTE — Patient Instructions (Signed)
 Shelly Andersen, thank you for choosing our office today! We appreciate the opportunity to meet your healthcare needs. You may receive a short survey by mail, e-mail, or through [employer]. If you are happy with your care we would appreciate if you could take just a few minutes to complete the survey questions. We read all of your comments and take your feedback very seriously. Thank you again for choosing our office.  Center for [employer] Team at [facility] at [facility] ([address] [place], [place] [address]) Entrance C, located off of [address] Free 24/7 valet parking  Go to [email or web address] to register for FREE online childbirth classes  Call the office [phone]) or go to [facility] if: You begin to severe cramping Your water breaks.  Sometimes it is a big gush of fluid, sometimes it is just a trickle that keeps getting your panties wet or running down your legs You have vaginal bleeding.  It is normal to have a small amount of spotting if your cervix was checked.   [facility] Pediatricians/Family Doctors [facility]): [address] Luba BROCKS, [phone]           [facility]: [address], [phone]                [facility] Medicine [facility]): [address], [phone] (call to ask if accepting patients) [facility] Department: [address], [place], [phone]    [facility] Pediatricians/Family Doctors [facility]): [phone] [address], [phone] [facility]: [address] [place], [phone] [facility]: [address], [phone]  [facility] Doctors  [facility]): [phone] [facility]: [address], [phone]   [facility] Doctors [facility]: [address], [phone]  [facility] Doctors  [facility]: [phone], [phone]  Home Blood Pressure Monitoring for Patients   Your provider has recommended that you check your blood pressure (BP) at least once a week at home. If you do not have a blood pressure cuff at home, one will be provided for you. Contact your provider if you have not received your monitor within 1 week.   Helpful Tips for Accurate Home Blood Pressure Checks  Don't smoke, exercise, or drink caffeine 30 minutes before checking your BP Use the restroom before checking your BP (a full bladder can raise your pressure) Relax in a comfortable upright chair Feet on the ground Left arm resting comfortably on a flat surface at the level of your heart Legs uncrossed Back supported Sit quietly and don't talk Place the cuff on your bare arm Adjust snuggly, so that only two fingertips can fit between your skin and the top of the cuff Check 2 readings separated by at least one minute Keep a log of your BP readings For a visual, please reference this diagram: http://ccnc.care/bpdiagram  Provider Name: [facility]     Phone: [phone]  Zone 1: ALL CLEAR  Continue to monitor your symptoms:  BP reading is less than 140 (top number) or less than 90 (bottom number)  No right upper stomach pain No headaches or seeing spots No feeling nauseated or throwing up No swelling in face and hands  Zone 2: CAUTION Call your doctor's office for any of the following:  BP reading is greater than 140 (top number) or greater than  90 (bottom number)  Stomach pain under your ribs in the middle or right side Headaches or seeing spots Feeling nauseated or throwing up Swelling in face and hands  Zone 3: EMERGENCY  Seek immediate medical care if you have any of the following:  BP reading is greater than160 (top number) or greater than 110 (bottom number) Severe headaches not improving with Tylenol  Serious difficulty catching your breath Any worsening symptoms from  Zone 2     Second Trimester of Pregnancy The second trimester is from week 14 through week 27 (months 4 through 6). The second trimester is often a time when you feel your best. Your body has adjusted to being pregnant, and you begin to feel better physically. Usually, morning sickness has lessened or quit completely, you may have more energy, and you may have an increase in appetite. The second trimester is also a time when the fetus is growing rapidly. At the end of the sixth month, the fetus is about 9 inches long and weighs about 1 pounds. You will likely begin to feel the baby move (quickening) between 16 and 20 weeks of pregnancy. Body changes during your second trimester Your body continues to go through many changes during your second trimester. The changes vary from woman to woman. Your weight will continue to increase. You will notice your lower abdomen bulging out. You may begin to get stretch marks on your hips, abdomen, and breasts. You may develop headaches that can be relieved by medicines. The medicines should be approved by your health care provider. You may urinate more often because the fetus is pressing on your bladder. You may develop or continue to have heartburn as a result of your pregnancy. You may develop constipation because certain hormones are causing the muscles that push waste through your intestines to slow down. You may develop hemorrhoids or swollen, bulging veins (varicose veins). You may have back pain. This is caused by: Weight gain. Pregnancy hormones that are relaxing the joints in your pelvis. A shift in weight and the muscles that support your balance. Your breasts will continue to grow and they will continue to become tender. Your gums may bleed and may be sensitive to brushing and flossing. Dark spots or blotches (chloasma, mask of pregnancy) may develop on your face. This will likely fade after the baby is born. A dark line from your belly button to  the pubic area (linea nigra) may appear. This will likely fade after the baby is born. You may have changes in your hair. These can include thickening of your hair, rapid growth, and changes in texture. Some women also have hair loss during or after pregnancy, or hair that feels dry or thin. Your hair will most likely return to normal after your baby is born.  What to expect at prenatal visits During a routine prenatal visit: You will be weighed to make sure you and the fetus are growing normally. Your blood pressure will be taken. Your abdomen will be measured to track your baby's growth. The fetal heartbeat will be listened to. Any test results from the previous visit will be discussed.  Your health care provider may ask you: How you are feeling. If you are feeling the baby move. If you have had any abnormal symptoms, such as leaking fluid, bleeding, severe headaches, or abdominal cramping. If you are using any tobacco products, including cigarettes, chewing tobacco, and electronic cigarettes. If you have any questions.  Other tests that may be performed during  your second trimester include: Blood tests that check for: Low iron levels (anemia). High blood sugar that affects pregnant women (gestational diabetes) between 84 and 28 weeks. Rh antibodies. This is to check for a protein on red blood cells (Rh factor). Urine tests to check for infections, diabetes, or protein in the urine. An ultrasound to confirm the proper growth and development of the baby. An amniocentesis to check for possible genetic problems. Fetal screens for spina bifida and Down syndrome. HIV (human immunodeficiency virus) testing. Routine prenatal testing includes screening for HIV, unless you choose not to have this test.  Follow these instructions at home: Medicines Follow your health care provider's instructions regarding medicine use. Specific medicines may be either safe or unsafe to take during  pregnancy. Take a prenatal vitamin that contains at least 600 micrograms (mcg) of folic acid. If you develop constipation, try taking a stool softener if your health care provider approves. Eating and drinking Eat a balanced diet that includes fresh fruits and vegetables, whole grains, good sources of protein such as meat, eggs, or tofu, and low-fat dairy. Your health care provider will help you determine the amount of weight gain that is right for you. Avoid raw meat and uncooked cheese. These carry germs that can cause birth defects in the baby. If you have low calcium intake from food, talk to your health care provider about whether you should take a daily calcium supplement. Limit foods that are high in fat and processed sugars, such as fried and sweet foods. To prevent constipation: Drink enough fluid to keep your urine clear or pale yellow. Eat foods that are high in fiber, such as fresh fruits and vegetables, whole grains, and beans. Activity Exercise only as directed by your health care provider. Most women can continue their usual exercise routine during pregnancy. Try to exercise for 30 minutes at least 5 days a week. Stop exercising if you experience uterine contractions. Avoid heavy lifting, wear low heel shoes, and practice good posture. A sexual relationship may be continued unless your health care provider directs you otherwise. Relieving pain and discomfort Wear a good support bra to prevent discomfort from breast tenderness. Take warm sitz baths to soothe any pain or discomfort caused by hemorrhoids. Use hemorrhoid cream if your health care provider approves. Rest with your legs elevated if you have leg cramps or low back pain. If you develop varicose veins, wear support hose. Elevate your feet for 15 minutes, 3-4 times a day. Limit salt in your diet. Prenatal Care Write down your questions. Take them to your prenatal visits. Keep all your prenatal visits as told by your health  care provider. This is important. Safety Wear your seat belt at all times when driving. Make a list of emergency phone numbers, including numbers for family, friends, the hospital, and police and fire departments. General instructions Ask your health care provider for a referral to a local prenatal education class. Begin classes no later than the beginning of month 6 of your pregnancy. Ask for help if you have counseling or nutritional needs during pregnancy. Your health care provider can offer advice or refer you to specialists for help with various needs. Do not use hot tubs, steam rooms, or saunas. Do not douche or use tampons or scented sanitary pads. Do not cross your legs for long periods of time. Avoid cat litter boxes and soil used by cats. These carry germs that can cause birth defects in the baby and possibly loss of the  fetus by miscarriage or stillbirth. Avoid all smoking, herbs, alcohol, and unprescribed drugs. Chemicals in these products can affect the formation and growth of the baby. Do not use any products that contain nicotine or tobacco, such as cigarettes and e-cigarettes. If you need help quitting, ask your health care provider. Visit your dentist if you have not gone yet during your pregnancy. Use a soft toothbrush to brush your teeth and be gentle when you floss. Contact a health care provider if: You have dizziness. You have mild pelvic cramps, pelvic pressure, or nagging pain in the abdominal area. You have persistent nausea, vomiting, or diarrhea. You have a bad smelling vaginal discharge. You have pain when you urinate. Get help right away if: You have a fever. You are leaking fluid from your vagina. You have spotting or bleeding from your vagina. You have severe abdominal cramping or pain. You have rapid weight gain or weight loss. You have shortness of breath with chest pain. You notice sudden or extreme swelling of your face, hands, ankles, feet, or legs. You  have not felt your baby move in over an hour. You have severe headaches that do not go away when you take medicine. You have vision changes. Summary The second trimester is from week 14 through week 27 (months 4 through 6). It is also a time when the fetus is growing rapidly. Your body goes through many changes during pregnancy. The changes vary from woman to woman. Avoid all smoking, herbs, alcohol, and unprescribed drugs. These chemicals affect the formation and growth your baby. Do not use any tobacco products, such as cigarettes, chewing tobacco, and e-cigarettes. If you need help quitting, ask your health care provider. Contact your health care provider if you have any questions. Keep all prenatal visits as told by your health care provider. This is important. This information is not intended to replace advice given to you by your health care provider. Make sure you discuss any questions you have with your health care provider. Document Released: 01/30/2001 Document Revised: 07/14/2015 Document Reviewed: 04/08/2012 Elsevier Interactive Patient Education  2017 [employer].

## 2024-01-21 NOTE — Progress Notes (Signed)
 LOW-RISK PREGNANCY VISIT Patient name: Shelly Andersen MRN [id number]  Date of birth: 2000-08-12 Chief Complaint:   Routine Prenatal Visit and Pregnancy Ultrasound  History of Present Illness:   Shelly Andersen is a 23 y.o. G73P0010 female at [redacted]w[redacted]d with an Estimated Date of Delivery: 06/06/24 being seen today for ongoing management of a low-risk pregnancy.   Today she reports itchy nipples, has been leaking. Contractions: Not present.  .  Movement: Present. denies leaking of fluid.     11/25/2023    9:09 AM 05/16/2020   11:01 AM 08/24/2016    4:35 PM  Depression screen PHQ 2/9  Decreased Interest 0 0 0  Down, Depressed, Hopeless 0 0 0  PHQ - 2 Score 0 0 0  Altered sleeping 0 0 0  Tired, decreased energy 0 0 0  Change in appetite 0 0 0  Feeling bad or failure about yourself  0 0 0  Trouble concentrating 0 0 0  Moving slowly or fidgety/restless 0 0 0  Suicidal thoughts 0 0 0  PHQ-9 Score 0  0  0   Difficult doing work/chores  Not difficult at all      Data saved with a previous flowsheet row definition        11/25/2023    9:09 AM  GAD 7 : Generalized Anxiety Score  Nervous, Anxious, on Edge 0  Control/stop worrying 0  Worry too much - different things 0  Trouble relaxing 0  Restless 0  Easily annoyed or irritable 0  Afraid - awful might happen 0  Total GAD 7 Score 0      Review of Systems:   Pertinent items are noted in HPI Denies abnormal vaginal discharge w/ itching/odor/irritation, headaches, visual changes, shortness of breath, chest pain, abdominal pain, severe nausea/vomiting, or problems with urination or bowel movements unless otherwise stated above. Pertinent History Reviewed:  Reviewed past medical,surgical, social, obstetrical and family history.  Reviewed problem list, medications and allergies. Physical Assessment:   Vitals:   01/21/24 1545  BP: 132/87  Pulse: (!) 115  Weight: 190 lb (86.2 kg)  Body mass index is 28.89 kg/m.        Physical  Examination:   General appearance: Well appearing, and in no distress  Mental status: Alert, oriented to person, place, and time  Skin: Warm & dry  Cardiovascular: Normal heart rate noted  Respiratory: Normal respiratory effort, no distress  Abdomen: Soft, gravid, nontender  Pelvic: Cervical exam deferred         Extremities: Edema: None  Fetal Status:     Movement: Present  US : GA = 20+3 weeks Single active female fetus, cephalic, FHR = 147 bpm, anterior pl, gr1, SVP = 5.3 cm, EFW 377g, 63%, CL = 5.2 cm, nl ov's, Anatomy screen completed, no apparent abn  Chaperone: N/A No results found for this or any previous visit (from the past 24 hours).  Assessment & Plan:  1) Low-risk pregnancy G2P0010 at [redacted]w[redacted]d with an Estimated Date of Delivery: 06/06/24   2) Itchy nipples, bilaterally, has been leaking, try breast pads, aquaphor/eucerin, hydrocortisone if needed   Meds: No orders of the defined types were placed in this encounter.  Labs/procedures today: U/S  Plan:  Continue routine obstetrical care  Next visit: prefers in person    Reviewed: Preterm labor symptoms and general obstetric precautions including but not limited to vaginal bleeding, contractions, leaking of fluid and fetal movement were reviewed in detail with the patient.  All questions were answered. Does have home bp cuff. Office bp cuff given: not applicable. Check bp weekly, let us  know if consistently >140 and/or >90.  Follow-up: Return in about 4 weeks (around 02/18/2024) for LROB, CNM, in person.  Future Appointments  Date Time Provider Department Center  02/19/2024  3:10 PM Kizzie Suzen SAUNDERS, CNM [facility]    No orders of the defined types were placed in this encounter.  Suzen SAUNDERS Kizzie CNM, [facility] 01/21/2024 4:08 PM

## 2024-02-06 ENCOUNTER — Encounter: Payer: Self-pay | Admitting: Women's Health

## 2024-02-09 ENCOUNTER — Other Ambulatory Visit: Payer: Self-pay

## 2024-02-09 ENCOUNTER — Emergency Department (HOSPITAL_COMMUNITY)
Admission: EM | Admit: 2024-02-09 | Discharge: 2024-02-09 | Disposition: A | Discharge status: Home / Self Care | Attending: Emergency Medicine | Admitting: Emergency Medicine

## 2024-02-09 ENCOUNTER — Emergency Department (HOSPITAL_COMMUNITY)

## 2024-02-09 ENCOUNTER — Encounter: Payer: Self-pay | Admitting: Women's Health

## 2024-02-09 ENCOUNTER — Encounter (HOSPITAL_COMMUNITY): Payer: Self-pay | Admitting: Emergency Medicine

## 2024-02-09 DIAGNOSIS — R000 Tachycardia, unspecified: Secondary | ICD-10-CM | POA: Insufficient documentation

## 2024-02-09 DIAGNOSIS — O99891 Other specified diseases and conditions complicating pregnancy: Secondary | ICD-10-CM | POA: Diagnosis not present

## 2024-02-09 DIAGNOSIS — O99512 Diseases of the respiratory system complicating pregnancy, second trimester: Secondary | ICD-10-CM | POA: Diagnosis present

## 2024-02-09 DIAGNOSIS — J069 Acute upper respiratory infection, unspecified: Secondary | ICD-10-CM | POA: Insufficient documentation

## 2024-02-09 DIAGNOSIS — Z3A23 23 weeks gestation of pregnancy: Secondary | ICD-10-CM

## 2024-02-09 LAB — RESP PANEL BY RT-PCR (RSV, FLU A&B, COVID)  RVPGX2
Influenza A by PCR: NEGATIVE
Influenza B by PCR: NEGATIVE
Resp Syncytial Virus by PCR: NEGATIVE
SARS Coronavirus 2 by RT PCR: NEGATIVE

## 2024-02-09 LAB — URINALYSIS, ROUTINE W REFLEX MICROSCOPIC
Bilirubin Urine: NEGATIVE
Glucose, UA: NEGATIVE mg/dL
Hgb urine dipstick: NEGATIVE
Ketones, ur: 20 mg/dL — AB
Nitrite: NEGATIVE
Protein, ur: NEGATIVE mg/dL
Specific Gravity, Urine: 1.01 (ref 1.005–1.030)
pH: 7 (ref 5.0–8.0)

## 2024-02-09 NOTE — ED Triage Notes (Addendum)
 Pov c/o of flu like symptoms that started Thursday. Pt called on call OB RN and d/t fevers/CP , pt needed to get checked out. Pt is [redacted]weeks pregnant. Denies any bleeding or belly pains

## 2024-02-09 NOTE — ED Provider Notes (Signed)
 " [place] EMERGENCY DEPARTMENT AT [facility] Provider Note   CSN: [id number] Arrival date & time: 02/09/24  1756     Patient presents with: Fever   Shelly Andersen is a 23 y.o. female.    Fever Associated symptoms: cough         Shelly Andersen is a 23 y.o. 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  She reports nasal congestion, coughing and fever.  Max fever at home of 101, improves after Tylenol .  She endorses forceful coughing and 1 episode of posttussive emesis today.  She is having some chest pain today that is associated with coughing only.  Denies any shortness of breath or hemoptysis.  She has had routine prenatal care, denies any complications with her pregnancy.  She is not currently having any vaginal bleeding, spotting or abdominal pain.  She states that she called the [facility] nurse on-call today and was advised to come to the ER for further evaluation. She denies ear pain, sore throat, peripheral edema, diarrhea or persistent vomiting   Prior to Admission medications  Medication Sig Start Date End Date Taking? Authorizing Provider  Blood Pressure Monitor MISC For regular home bp monitoring during pregnancy 11/25/23   Kizzie Suzen SAUNDERS, CNM  diltiazem  (CARDIZEM ) 60 MG tablet Take 1 tablet (60 mg total) by mouth 3 (three) times daily as needed. For heart rate greater then 150. 06/17/23   Gollan, Timothy J, MD  Prenatal Vit-Fe Fumarate-FA (PRENATAL MULTIVITAMIN) TABS tablet Take 1 tablet by mouth daily at 12 noon.    [provider]  promethazine  (PHENERGAN ) 25 MG tablet Take 0.5-1 tablets (12.5-25 mg total) by mouth every 6 (six) hours as needed. Patient not taking: Reported on 01/21/2024 11/25/23   Kizzie Suzen SAUNDERS, CNM    Allergies: Other    Review of Systems  Constitutional:  Positive for fever.   Respiratory:  Positive for cough.   All other systems reviewed and are negative.   Updated Vital Signs BP (!) 132/96 (BP Location: Right Arm)   Pulse (!) 106   Temp 97.8 F (36.6 C)   Resp 18   Ht 5' 8 (1.727 m)   Wt 87.1 kg   LMP 08/31/2023   SpO2 96%   BMI 29.19 kg/m   Physical Exam Vitals and nursing note reviewed.  Constitutional:      General: She is not in acute distress.    Appearance: Normal appearance. She is not toxic-appearing.  HENT:     Right Ear: Tympanic membrane and ear canal normal.     Left Ear: Tympanic membrane and ear canal normal.     Mouth/Throat:     Mouth: Mucous membranes are moist.     Pharynx: Oropharynx is clear. No posterior oropharyngeal erythema.  Eyes:     Conjunctiva/sclera: Conjunctivae normal.  Cardiovascular:     Rate and Rhythm: Regular rhythm. Tachycardia present.     Pulses: Normal pulses.  Pulmonary:     Effort: Pulmonary effort is normal.  Abdominal:     Palpations: Abdomen is soft.     Tenderness: There is no abdominal tenderness.     Comments: Abdomen is gravid  Musculoskeletal:        General: Normal range of motion.     Cervical back: Normal range of motion. No rigidity.  Lymphadenopathy:  Cervical: No cervical adenopathy.  Skin:    General: Skin is warm.     Capillary Refill: Capillary refill takes less than 2 seconds.  Neurological:     General: No focal deficit present.     Mental Status: She is alert.     Sensory: No sensory deficit.     Motor: No weakness.     (all labs ordered are listed, but only abnormal results are displayed) Labs Reviewed  URINALYSIS, ROUTINE W REFLEX MICROSCOPIC - Abnormal; Notable for the following components:      Result Value   APPearance HAZY (*)    Ketones, ur 20 (*)    Leukocytes,Ua SMALL (*)    Bacteria, UA FEW (*)    All other components within normal limits  RESP PANEL BY RT-PCR (RSV, FLU A&B, COVID)  [id number]    EKG: EKG Interpretation Date/Time:  Sunday  February 09 2024 19:32:47 EST Ventricular Rate:  108 PR Interval:  110 QRS Duration:  86 QT Interval:  338 QTC Calculation: 453 R Axis:   80  Text Interpretation: Sinus tachycardia Low voltage, precordial leads Borderline repolarization abnormality Confirmed by Yolande Charleston [phone]) on 02/09/2024 7:41:18 PM  Radiology: DG Chest Portable 1 View Result Date: 02/09/2024 EXAM: 1 VIEW(S) XRAY OF THE CHEST 02/09/2024 08:36:01 PM COMPARISON: None available. CLINICAL HISTORY: cough, chest pain FINDINGS: LUNGS AND PLEURA: No focal pulmonary opacity. No pleural effusion. No pneumothorax. HEART AND MEDIASTINUM: No acute abnormality of the cardiac and mediastinal silhouettes. BONES AND SOFT TISSUES: No acute osseous abnormality. IMPRESSION: 1. No acute process. Electronically signed by: Greig Pique MD 02/09/2024 08:37 PM EST RP Workstation: [id number]     Procedures   Medications Ordered in the ED - No data to display                                  Medical Decision Making  Patient who is [redacted] weeks pregnant and [facility].  Here for evaluation of flulike symptoms that began on Thursday.  Has some chest pain associated with coughing only.  Denies any shortness of breath wheezing or hemoptysis.  She does have some nasal congestion as well.  No abdominal pains vaginal bleeding or spotting.  Pt placed on fetal monitoring upon arrival.  Fetal HR of 154, strong fetal movement noted  Suspect viral process.  Doubt ACS or PE  Amount and/or Complexity of Data Reviewed Labs: ordered.    Details: Respiratory panel negative Radiology: ordered.    Details: Patient agreeable to chest x-ray, abdomen was shielded.  Chest x-ray without acute process ECG/medicine tests: ordered.    Details: EKG shows sinus tachycardia Discussion of management or test interpretation with external provider(s):   Patient has been observed here with fetal monitoring.  Strong fetal movement  and heart rate noted.  I suspect her symptoms are related to URI.  She has tolerated oral fluids and snacks without difficulty.  [facility].  I have consulted with pharmacy regarding appropriate use of guaifenesin  or dextromethorphan for her cough.  She will continue Tylenol  if needed for fever and/or bodyaches. She appears appropriate for discharge home, all questions were answered, she was given strict return precautions        Final diagnoses:  Upper respiratory tract infection, unspecified type  [redacted] weeks gestation of pregnancy    ED Discharge Orders  None          Herlinda Milling, PA-C 02/11/24 1019    Yolande Lamar BROCKS, MD 02/16/24 2036  "

## 2024-02-09 NOTE — ED Notes (Signed)
 Fetal HR 155, fetus is very active.

## 2024-02-09 NOTE — Discharge Instructions (Addendum)
 You may take over-the-counter dextromethorphan or guaifenesin  as directed for your cough.  Please continue to take Tylenol  which is safe in pregnancy.  Please follow-up with your obstetrician tomorrow for recheck.  Return to the emergency department for any new or worsening symptoms.

## 2024-02-09 NOTE — ED Notes (Signed)
 See triage notes. Nad. Pt on fetal monitor and baby very active. Nad. Pt is a/o. Color wnl. Non diaphoretic.

## 2024-02-19 ENCOUNTER — Ambulatory Visit: Admitting: Women's Health

## 2024-02-19 ENCOUNTER — Encounter: Payer: Self-pay | Admitting: Women's Health

## 2024-02-19 VITALS — BP 118/83 | HR 91 | Wt 197.0 lb

## 2024-02-19 DIAGNOSIS — Z3A24 24 weeks gestation of pregnancy: Secondary | ICD-10-CM

## 2024-02-19 DIAGNOSIS — Z3482 Encounter for supervision of other normal pregnancy, second trimester: Secondary | ICD-10-CM

## 2024-02-19 DIAGNOSIS — Z3480 Encounter for supervision of other normal pregnancy, unspecified trimester: Secondary | ICD-10-CM

## 2024-02-19 NOTE — Patient Instructions (Signed)
 [place], thank you for choosing our office today! We appreciate the opportunity to meet your healthcare needs. You may receive a short survey by mail, e-mail, or through [employer]. If you are happy with your care we would appreciate if you could take just a few minutes to complete the survey questions. We read all of your comments and take your feedback very seriously. Thank you again for choosing our office.  Center for [employer] Team at [facility]  [facility]ating [facility] at [facility] ([address] [place], [place] [address]) Entrance C, located off of [address] Free 24/7 valet parking   You will have your sugar test next visit.  Please do not eat or drink anything after midnight the night before you come, not even water.  You will be here for at least two hours.  Please make an appointment online for the bloodwork at [email or web address] for 8:00am (or as close to this as possible). Make sure you select the [facility] service center.   CLASSES: Go to [email or web address] to register for classes (childbirth, breastfeeding, waterbirth, infant CPR, daddy bootcamp, etc.)  Call the office [phone]) or go to [facility] if: You begin to have strong, frequent contractions Your water breaks.  Sometimes it is a big gush of fluid, sometimes it is just a trickle that keeps getting your panties wet or running down your legs You have vaginal bleeding.  It is normal to have a small amount of spotting if your cervix was checked.  You don't feel your baby moving like normal.  If you don't, get you something to eat and drink and lay down and focus on feeling your baby move.   If your baby is still not moving like normal, you should call the office or go to [facility].  Call the office [phone]) or go to [facility] hospital for these signs of pre-eclampsia: Severe headache that does not go away with Tylenol  Visual changes- seeing spots, double, blurred vision Pain under your right breast or upper  abdomen that does not go away with Tums or heartburn medicine Nausea and/or vomiting Severe swelling in your hands, feet, and face    [place] Pediatricians/Family Doctors [facility]): [address] Luba BROCKS, [phone]           [facility]: [address], [phone]                [facility]): [address], [phone]  [facility]: [address], [place], [phone]    [facility] Pediatricians/Family Doctors [facility]): [address], [phone] [facility]: [address] [place], [phone] [facility]: [address], [phone]  [facility] Doctors  [facility]): [phone] [facility]: [address], [phone]   [facility] Doctors [facility]: [address] [phone]  [facility] Doctors  [facility]: [phone], [phone]  Home Blood Pressure Monitoring for Patients   Your provider has recommended that you check your blood pressure (BP) at least once a week at home. If you do not have a blood pressure cuff at home, one will be provided for you. Contact your provider if you have not received your monitor within 1 week.   Helpful Tips for Accurate Home Blood Pressure Checks  Don't smoke, exercise, or drink caffeine 30 minutes before checking  your BP Use the restroom before checking your BP (a full bladder can raise your pressure) Relax in a comfortable upright chair Feet on the ground Left arm resting comfortably on a flat surface at the level of your heart Legs uncrossed Back supported Sit quietly and don't talk Place the cuff on your bare arm Adjust snuggly, so that only two fingertips can fit between your skin and the top of the cuff Check 2 readings separated by at least one  minute Keep a log of your BP readings For a visual, please reference this diagram: http://ccnc.care/bpdiagram  Provider Name: [facility]     Phone: [phone]  Zone 1: ALL CLEAR  Continue to monitor your symptoms:  BP reading is less than 140 (top number) or less than 90 (bottom number)  No right upper stomach pain No headaches or seeing spots No feeling nauseated or throwing up No swelling in face and hands  Zone 2: CAUTION Call your doctor's office for any of the following:  BP reading is greater than 140 (top number) or greater than 90 (bottom number)  Stomach pain under your ribs in the middle or right side Headaches or seeing spots Feeling nauseated or throwing up Swelling in face and hands  Zone 3: EMERGENCY  Seek immediate medical care if you have any of the following:  BP reading is greater than160 (top number) or greater than 110 (bottom number) Severe headaches not improving with Tylenol  Serious difficulty catching your breath Any worsening symptoms from Zone 2   Second Trimester of Pregnancy The second trimester is from week 13 through week 28, months 4 through 6. The second trimester is often a time when you feel your best. Your body has also adjusted to being pregnant, and you begin to feel better physically. Usually, morning sickness has lessened or quit completely, you may have more energy, and you may have an increase in appetite. The second trimester is also a time when the fetus is growing rapidly. At the end of the sixth month, the fetus is about 9 inches long and weighs about 1 pounds. You will likely begin to feel the baby move (quickening) between 18 and 20 weeks of the pregnancy. BODY CHANGES Your body goes through many changes during pregnancy. The changes vary from woman to woman.  Your weight will continue to increase. You will notice your lower abdomen bulging out. You may begin to get stretch marks on your hips, abdomen, and breasts. You may  develop headaches that can be relieved by medicines approved by your health care provider. You may urinate more often because the fetus is pressing on your bladder. You may develop or continue to have heartburn as a result of your pregnancy. You may develop constipation because certain hormones are causing the muscles that push waste through your intestines to slow down. You may develop hemorrhoids or swollen, bulging veins (varicose veins). You may have back pain because of the weight gain and pregnancy hormones relaxing your joints between the bones in your pelvis and as a result of a shift in weight and the muscles that support your balance. Your breasts will continue to grow and be tender. Your gums may bleed and may be sensitive to brushing and flossing. Dark spots or blotches (chloasma, mask of pregnancy) may develop on your face. This will likely fade after the baby is born. A dark line from your belly button to the pubic area (linea nigra) may appear. This will likely fade after the  baby is born. You may have changes in your hair. These can include thickening of your hair, rapid growth, and changes in texture. Some women also have hair loss during or after pregnancy, or hair that feels dry or thin. Your hair will most likely return to normal after your baby is born. WHAT TO EXPECT AT YOUR PRENATAL VISITS During a routine prenatal visit: You will be weighed to make sure you and the fetus are growing normally. Your blood pressure will be taken. Your abdomen will be measured to track your baby's growth. The fetal heartbeat will be listened to. Any test results from the previous visit will be discussed. Your health care provider may ask you: How you are feeling. If you are feeling the baby move. If you have had any abnormal symptoms, such as leaking fluid, bleeding, severe headaches, or abdominal cramping. If you have any questions. Other tests that may be performed during your second  trimester include: Blood tests that check for: Low iron levels (anemia). Gestational diabetes (between 24 and 28 weeks). Rh antibodies. Urine tests to check for infections, diabetes, or protein in the urine. An ultrasound to confirm the proper growth and development of the baby. An amniocentesis to check for possible genetic problems. Fetal screens for spina bifida and Down syndrome. HOME CARE INSTRUCTIONS  Avoid all smoking, herbs, alcohol, and unprescribed drugs. These chemicals affect the formation and growth of the baby. Follow your health care provider's instructions regarding medicine use. There are medicines that are either safe or unsafe to take during pregnancy. Exercise only as directed by your health care provider. Experiencing uterine cramps is a good sign to stop exercising. Continue to eat regular, healthy meals. Wear a good support bra for breast tenderness. Do not use hot tubs, steam rooms, or saunas. Wear your seat belt at all times when driving. Avoid raw meat, uncooked cheese, cat litter boxes, and soil used by cats. These carry germs that can cause birth defects in the baby. Take your prenatal vitamins. Try taking a stool softener (if your health care provider approves) if you develop constipation. Eat more high-fiber foods, such as fresh vegetables or fruit and whole grains. Drink plenty of fluids to keep your urine clear or pale yellow. Take warm sitz baths to soothe any pain or discomfort caused by hemorrhoids. Use hemorrhoid cream if your health care provider approves. If you develop varicose veins, wear support hose. Elevate your feet for 15 minutes, 3-4 times a day. Limit salt in your diet. Avoid heavy lifting, wear low heel shoes, and practice good posture. Rest with your legs elevated if you have leg cramps or low back pain. Visit your dentist if you have not gone yet during your pregnancy. Use a soft toothbrush to brush your teeth and be gentle when you floss. A  sexual relationship may be continued unless your health care provider directs you otherwise. Continue to go to all your prenatal visits as directed by your health care provider. SEEK MEDICAL CARE IF:  You have dizziness. You have mild pelvic cramps, pelvic pressure, or nagging pain in the abdominal area. You have persistent nausea, vomiting, or diarrhea. You have a bad smelling vaginal discharge. You have pain with urination. SEEK IMMEDIATE MEDICAL CARE IF:  You have a fever. You are leaking fluid from your vagina. You have spotting or bleeding from your vagina. You have severe abdominal cramping or pain. You have rapid weight gain or loss. You have shortness of breath with chest pain. You  notice sudden or extreme swelling of your face, hands, ankles, feet, or legs. You have not felt your baby move in over an hour. You have severe headaches that do not go away with medicine. You have vision changes. Document Released: 01/30/2001 Document Revised: 02/10/2013 Document Reviewed: 04/08/2012 [facility] Patient Information 2015 [place], [place]. This information is not intended to replace advice given to you by your health care provider. Make sure you discuss any questions you have with your health care provider.

## 2024-02-19 NOTE — Progress Notes (Signed)
 "   LOW-RISK PREGNANCY VISIT Patient name: Shelly Andersen MRN [id number]  Date of birth: 08/07/2000 Chief Complaint:   Routine Prenatal Visit  History of Present Illness:   Shelly Andersen is a 23 y.o. G42P0010 female at [redacted]w[redacted]d with an Estimated Date of Delivery: 06/06/24 being seen today for ongoing management of a low-risk pregnancy.   Today she reports recent viral illness, all swabs neg at ED.SABRA Contractions: Not present.  .  Movement: Present. denies leaking of fluid.     11/25/2023    9:09 AM 05/16/2020   11:01 AM 08/24/2016    4:35 PM  Depression screen PHQ 2/9  Decreased Interest 0 0 0  Down, Depressed, Hopeless 0 0 0  PHQ - 2 Score 0 0 0  Altered sleeping 0 0 0  Tired, decreased energy 0 0 0  Change in appetite 0 0 0  Feeling bad or failure about yourself  0 0 0  Trouble concentrating 0 0 0  Moving slowly or fidgety/restless 0 0 0  Suicidal thoughts 0 0 0  PHQ-9 Score 0  0  0   Difficult doing work/chores  Not difficult at all      Data saved with a previous flowsheet row definition        11/25/2023    9:09 AM  GAD 7 : Generalized Anxiety Score  Nervous, Anxious, on Edge 0  Control/stop worrying 0  Worry too much - different things 0  Trouble relaxing 0  Restless 0  Easily annoyed or irritable 0  Afraid - awful might happen 0  Total GAD 7 Score 0      Review of Systems:   Pertinent items are noted in HPI Denies abnormal vaginal discharge w/ itching/odor/irritation, headaches, visual changes, shortness of breath, chest pain, abdominal pain, severe nausea/vomiting, or problems with urination or bowel movements unless otherwise stated above. Pertinent History Reviewed:  Reviewed past medical,surgical, social, obstetrical and family history.  Reviewed problem list, medications and allergies. Physical Assessment:   Vitals:   02/19/24 1506  BP: 118/83  Pulse: 91  Weight: 197 lb (89.4 kg)  Body mass index is 29.95 kg/m.        Physical Examination:    General appearance: Well appearing, and in no distress  Mental status: Alert, oriented to person, place, and time  Skin: Warm & dry  Cardiovascular: Normal heart rate noted  Respiratory: Normal respiratory effort, no distress  Abdomen: Soft, gravid, nontender  Pelvic: Cervical exam deferred         Extremities: Edema: None  Fetal Status: Fetal Heart Rate (bpm): 147 Fundal Height: 24 cm Movement: Present    Chaperone: N/A No results found for this or any previous visit (from the past 24 hours).  Assessment & Plan:  1) Low-risk pregnancy G2P0010 at [redacted]w[redacted]d with an Estimated Date of Delivery: 06/06/24    Meds: No orders of the defined types were placed in this encounter.  Labs/procedures today: none  Plan:  Continue routine obstetrical care  Next visit: prefers will be in person for pn2    Reviewed: Preterm labor symptoms and general obstetric precautions including but not limited to vaginal bleeding, contractions, leaking of fluid and fetal movement were reviewed in detail with the patient.  All questions were answered. Does have home bp cuff. Office bp cuff given: not applicable. Check bp weekly, let us  know if consistently >140 and/or >90.  Follow-up: Return in about 4 weeks (around 03/18/2024) for LROB, PN2, CNM, in person.  No future appointments.  No orders of the defined types were placed in this encounter.  Suzen JONELLE Fetters CNM, [facility] 02/19/2024 3:39 PM  "

## 2024-03-13 ENCOUNTER — Encounter: Payer: Self-pay | Admitting: Women's Health

## 2024-03-18 ENCOUNTER — Ambulatory Visit: Admitting: Women's Health

## 2024-03-18 ENCOUNTER — Other Ambulatory Visit

## 2024-03-18 ENCOUNTER — Encounter: Payer: Self-pay | Admitting: Women's Health

## 2024-03-18 VITALS — BP 142/88 | HR 118 | Wt 206.0 lb

## 2024-03-18 DIAGNOSIS — R030 Elevated blood-pressure reading, without diagnosis of hypertension: Secondary | ICD-10-CM | POA: Diagnosis not present

## 2024-03-18 DIAGNOSIS — O99891 Other specified diseases and conditions complicating pregnancy: Secondary | ICD-10-CM | POA: Diagnosis not present

## 2024-03-18 DIAGNOSIS — Z3A28 28 weeks gestation of pregnancy: Secondary | ICD-10-CM

## 2024-03-18 DIAGNOSIS — Z3483 Encounter for supervision of other normal pregnancy, third trimester: Secondary | ICD-10-CM

## 2024-03-18 DIAGNOSIS — Z131 Encounter for screening for diabetes mellitus: Secondary | ICD-10-CM

## 2024-03-18 DIAGNOSIS — Z3480 Encounter for supervision of other normal pregnancy, unspecified trimester: Secondary | ICD-10-CM

## 2024-03-18 LAB — POCT URINALYSIS DIPSTICK OB
Blood, UA: NEGATIVE
Glucose, UA: NEGATIVE
Ketones, UA: NEGATIVE
Nitrite, UA: NEGATIVE
POC,PROTEIN,UA: NEGATIVE

## 2024-03-18 NOTE — Progress Notes (Signed)
 "   LOW-RISK PREGNANCY VISIT Patient name: Shelly Andersen MRN [id number]  Date of birth: May 22, 2000 Chief Complaint:   Routine Prenatal Visit  History of Present Illness:   Shelly Andersen is a 24 y.o. G55P0010 female at [redacted]w[redacted]d with an Estimated Date of Delivery: 06/06/24 being seen today for ongoing management of a low-risk pregnancy.   Today she reports checks home bp daily, all have been great. Denies ha, visual changes, ruq/epigastric pain, n/v.  Contractions: Not present. Vag. Bleeding: None.  Movement: Present. denies leaking of fluid.     11/25/2023    9:09 AM 05/16/2020   11:01 AM 08/24/2016    4:35 PM  Depression screen PHQ 2/9  Decreased Interest 0 0 0  Down, Depressed, Hopeless 0 0 0  PHQ - 2 Score 0 0 0  Altered sleeping 0 0 0  Tired, decreased energy 0 0 0  Change in appetite 0 0 0  Feeling bad or failure about yourself  0 0 0  Trouble concentrating 0 0 0  Moving slowly or fidgety/restless 0 0 0  Suicidal thoughts 0 0 0  PHQ-9 Score 0  0  0   Difficult doing work/chores  Not difficult at all      Data saved with a previous flowsheet row definition        11/25/2023    9:09 AM  GAD 7 : Generalized Anxiety Score  Nervous, Anxious, on Edge 0   Control/stop worrying 0   Worry too much - different things 0   Trouble relaxing 0   Restless 0   Easily annoyed or irritable 0   Afraid - awful might happen 0   Total GAD 7 Score 0     Data saved with a previous flowsheet row definition      Review of Systems:   Pertinent items are noted in HPI Denies abnormal vaginal discharge w/ itching/odor/irritation, headaches, visual changes, shortness of breath, chest pain, abdominal pain, severe nausea/vomiting, or problems with urination or bowel movements unless otherwise stated above. Pertinent History Reviewed:  Reviewed past medical,surgical, social, obstetrical and family history.  Reviewed problem list, medications and allergies. Physical Assessment:   Vitals:    03/18/24 0915 03/18/24 0916  BP: (!) 167/87 (!) 142/88  Pulse: (!) 118   Weight: 206 lb (93.4 kg)   Body mass index is 31.32 kg/m.        Physical Examination:   General appearance: Well appearing, and in no distress  Mental status: Alert, oriented to person, place, and time  Skin: Warm & dry  Cardiovascular: Normal heart rate noted  Respiratory: Normal respiratory effort, no distress  Abdomen: Soft, gravid, nontender  Pelvic: Cervical exam deferred         Extremities: Edema: None  Fetal Status: Fetal Heart Rate (bpm): 140 Fundal Height: 28 cm Movement: Present    Chaperone: N/A Results for orders placed or performed in visit on 03/18/24 (from the past 24 hours)  POC Urinalysis Dipstick OB   Collection Time: 03/18/24  9:32 AM  Result Value Ref Range   Color, UA     Clarity, UA     Glucose, UA Negative Negative   Bilirubin, UA     Ketones, UA neg    Spec Grav, UA     Blood, UA neg    pH, UA     POC,PROTEIN,UA Negative Negative, Trace, Small (1+), Moderate (2+), Large (3+), 4+   Urobilinogen, UA     Nitrite, UA neg  Leukocytes, UA Trace (A) Negative   Appearance     Odor      Assessment & Plan:  1) Low-risk pregnancy G2P0010 at [redacted]w[redacted]d with an Estimated Date of Delivery: 06/06/24   2) Elevated bp, have been great at home, checks daily. Is very hot in lab, hates getting blood drawn. No protein, asymptomatic, will check pre-e labs, f/u tomorrow for bp check. Continue to check home bp daily, if >140/90 or pre-e sx let us  know   Meds: No orders of the defined types were placed in this encounter.  Labs/procedures today: PN2 and declines tdap today, maybe next visit, pre-e labs  Plan:  Continue routine obstetrical care  Next visit: prefers in person    Reviewed: Preterm labor symptoms and general obstetric precautions including but not limited to vaginal bleeding, contractions, leaking of fluid and fetal movement were reviewed in detail with the patient.  All questions were  answered. Does have home bp cuff. Office bp cuff given: not applicable. Check bp daily, let us  know if consistently >140 and/or >90.  Follow-up: Return in about 1 day (around 03/19/2024) for nurse bp check, in person; then 2wks LROB w/ CNM in person.  No future appointments.  Orders Placed This Encounter  Procedures   Comprehensive metabolic panel with GFR   Protein / creatinine ratio, urine   POC Urinalysis Dipstick OB   Suzen JONELLE Fetters CNM, [facility] 03/18/2024 9:43 AM  "

## 2024-03-18 NOTE — Patient Instructions (Signed)
 [place], thank you for choosing our office today! We appreciate the opportunity to meet your healthcare needs. You may receive a short survey by mail, e-mail, or through [employer]. If you are happy with your care we would appreciate if you could take just a few minutes to complete the survey questions. We read all of your comments and take your feedback very seriously. Thank you again for choosing our office.  Center for [employer] Team at [facility]  [facility] at [facility] ([address] [place], [place] [address]) Entrance C, located off of [address] Free 24/7 valet parking   CLASSES: Go to [email or web address] to register for classes (childbirth, breastfeeding, waterbirth, infant CPR, daddy bootcamp, etc.)  Call the office [phone]) or go to [facility] if: You begin to have strong, frequent contractions Your water breaks.  Sometimes it is a big gush of fluid, sometimes it is just a trickle that keeps getting your panties wet or running down your legs You have vaginal bleeding.  It is normal to have a small amount of spotting if your cervix was checked.  You don't feel your baby moving like normal.  If you don't, get you something to eat and drink and lay down and focus on feeling your baby move.   If your baby is still not moving like normal, you should call the office or go to [facility].  Call the office [phone]) or go to [facility] hospital for these signs of pre-eclampsia: Severe headache that does not go away with Tylenol  Visual changes- seeing spots, double, blurred vision Pain under your right breast or upper abdomen that does not go away with Tums or heartburn medicine Nausea and/or vomiting Severe swelling in your hands, feet, and face   Tdap Vaccine It is recommended that you get the Tdap vaccine during the third trimester of EACH pregnancy to help protect your baby from getting pertussis (whooping cough) 27-36 weeks is the BEST time to do  this so that you can pass the protection on to your baby. During pregnancy is better than after pregnancy, but if you are unable to get it during pregnancy it will be offered at the hospital.  You can get this vaccine with us , at the health department, your family doctor, or some local pharmacies Everyone who will be around your baby should also be up-to-date on their vaccines before the baby comes. Adults (who are not pregnant) only need 1 dose of Tdap during adulthood.   [facility] Pediatricians/Family Doctors [facility]): [address] Luba BROCKS, [phone]           [facility]: [address], [phone]                [facility]): [address], [phone] (call to ask if accepting patients) [facility] Department: [address], [place], [phone]    [facility] Pediatricians/Family Doctors [facility]): [phone] [address], [phone] [facility]: [address] [place], [phone] [facility]: [address], [phone]  [facility] Doctors  [facility]): [phone] [facility]: [address] [phone]   [facility] Doctors [facility]: [address] [phone]  [facility] Family Doctors  [facility]: [phone], [phone]  Home Blood Pressure Monitoring for Patients   Your provider has recommended that you check your  blood pressure (BP) at least once a week at home. If you do not have a blood pressure cuff at home, one will be provided for you. Contact your provider if you have not received your monitor within 1 week.   Helpful Tips for Accurate Home Blood Pressure Checks  Don't smoke, exercise, or drink caffeine 30 minutes before checking your BP Use the restroom before checking your BP (a full bladder can raise your  pressure) Relax in a comfortable upright chair Feet on the ground Left arm resting comfortably on a flat surface at the level of your heart Legs uncrossed Back supported Sit quietly and don't talk Place the cuff on your bare arm Adjust snuggly, so that only two fingertips can fit between your skin and the top of the cuff Check 2 readings separated by at least one minute Keep a log of your BP readings For a visual, please reference this diagram: http://ccnc.care/bpdiagram  Provider Name: [facility]     Phone: [phone]  Zone 1: ALL CLEAR  Continue to monitor your symptoms:  BP reading is less than 140 (top number) or less than 90 (bottom number)  No right upper stomach pain No headaches or seeing spots No feeling nauseated or throwing up No swelling in face and hands  Zone 2: CAUTION Call your doctor's office for any of the following:  BP reading is greater than 140 (top number) or greater than 90 (bottom number)  Stomach pain under your ribs in the middle or right side Headaches or seeing spots Feeling nauseated or throwing up Swelling in face and hands  Zone 3: EMERGENCY  Seek immediate medical care if you have any of the following:  BP reading is greater than160 (top number) or greater than 110 (bottom number) Severe headaches not improving with Tylenol  Serious difficulty catching your breath Any worsening symptoms from Zone 2   Third Trimester of Pregnancy The third trimester is from week 29 through week 42, months 7 through 9. The third trimester is a time when the fetus is growing rapidly. At the end of the ninth month, the fetus is about 20 inches in length and weighs 6-10 pounds.  BODY CHANGES Your body goes through many changes during pregnancy. The changes vary from woman to woman.  Your weight will continue to increase. You can expect to gain 25-35 pounds (11-16 kg) by the end of the pregnancy. You may begin to get stretch marks on your hips, abdomen,  and breasts. You may urinate more often because the fetus is moving lower into your pelvis and pressing on your bladder. You may develop or continue to have heartburn as a result of your pregnancy. You may develop constipation because certain hormones are causing the muscles that push waste through your intestines to slow down. You may develop hemorrhoids or swollen, bulging veins (varicose veins). You may have pelvic pain because of the weight gain and pregnancy hormones relaxing your joints between the bones in your pelvis. Backaches may result from overexertion of the muscles supporting your posture. You may have changes in your hair. These can include thickening of your hair, rapid growth, and changes in texture. Some women also have hair loss during or after pregnancy, or hair that feels dry or thin. Your hair will most likely return to normal after your baby is born. Your breasts will continue to grow and be tender. A yellow discharge may leak from your breasts called colostrum. Your belly button may stick out. You may  feel short of breath because of your expanding uterus. You may notice the fetus dropping, or moving lower in your abdomen. You may have a bloody mucus discharge. This usually occurs a few days to a week before labor begins. Your cervix becomes thin and soft (effaced) near your due date. WHAT TO EXPECT AT YOUR PRENATAL EXAMS  You will have prenatal exams every 2 weeks until week 36. Then, you will have weekly prenatal exams. During a routine prenatal visit: You will be weighed to make sure you and the fetus are growing normally. Your blood pressure is taken. Your abdomen will be measured to track your baby's growth. The fetal heartbeat will be listened to. Any test results from the previous visit will be discussed. You may have a cervical check near your due date to see if you have effaced. At around 36 weeks, your caregiver will check your cervix. At the same time, your  caregiver will also perform a test on the secretions of the vaginal tissue. This test is to determine if a type of bacteria, Group B streptococcus, is present. Your caregiver will explain this further. Your caregiver may ask you: What your birth plan is. How you are feeling. If you are feeling the baby move. If you have had any abnormal symptoms, such as leaking fluid, bleeding, severe headaches, or abdominal cramping. If you have any questions. Other tests or screenings that may be performed during your third trimester include: Blood tests that check for low iron levels (anemia). Fetal testing to check the health, activity level, and growth of the fetus. Testing is done if you have certain medical conditions or if there are problems during the pregnancy. FALSE LABOR You may feel small, irregular contractions that eventually go away. These are called Braxton Hicks contractions, or false labor. Contractions may last for hours, days, or even weeks before true labor sets in. If contractions come at regular intervals, intensify, or become painful, it is best to be seen by your caregiver.  SIGNS OF LABOR  Menstrual-like cramps. Contractions that are 5 minutes apart or less. Contractions that start on the top of the uterus and spread down to the lower abdomen and back. A sense of increased pelvic pressure or back pain. A watery or bloody mucus discharge that comes from the vagina. If you have any of these signs before the 37th week of pregnancy, call your caregiver right away. You need to go to the hospital to get checked immediately. HOME CARE INSTRUCTIONS  Avoid all smoking, herbs, alcohol, and unprescribed drugs. These chemicals affect the formation and growth of the baby. Follow your caregiver's instructions regarding medicine use. There are medicines that are either safe or unsafe to take during pregnancy. Exercise only as directed by your caregiver. Experiencing uterine cramps is a good sign to  stop exercising. Continue to eat regular, healthy meals. Wear a good support bra for breast tenderness. Do not use hot tubs, steam rooms, or saunas. Wear your seat belt at all times when driving. Avoid raw meat, uncooked cheese, cat litter boxes, and soil used by cats. These carry germs that can cause birth defects in the baby. Take your prenatal vitamins. Try taking a stool softener (if your caregiver approves) if you develop constipation. Eat more high-fiber foods, such as fresh vegetables or fruit and whole grains. Drink plenty of fluids to keep your urine clear or pale yellow. Take warm sitz baths to soothe any pain or discomfort caused by hemorrhoids. Use hemorrhoid cream if  your caregiver approves. If you develop varicose veins, wear support hose. Elevate your feet for 15 minutes, 3-4 times a day. Limit salt in your diet. Avoid heavy lifting, wear low heal shoes, and practice good posture. Rest a lot with your legs elevated if you have leg cramps or low back pain. Visit your dentist if you have not gone during your pregnancy. Use a soft toothbrush to brush your teeth and be gentle when you floss. A sexual relationship may be continued unless your caregiver directs you otherwise. Do not travel far distances unless it is absolutely necessary and only with the approval of your caregiver. Take prenatal classes to understand, practice, and ask questions about the labor and delivery. Make a trial run to the hospital. Pack your hospital bag. Prepare the baby's nursery. Continue to go to all your prenatal visits as directed by your caregiver. SEEK MEDICAL CARE IF: You are unsure if you are in labor or if your water has broken. You have dizziness. You have mild pelvic cramps, pelvic pressure, or nagging pain in your abdominal area. You have persistent nausea, vomiting, or diarrhea. You have a bad smelling vaginal discharge. You have pain with urination. SEEK IMMEDIATE MEDICAL CARE IF:  You  have a fever. You are leaking fluid from your vagina. You have spotting or bleeding from your vagina. You have severe abdominal cramping or pain. You have rapid weight loss or gain. You have shortness of breath with chest pain. You notice sudden or extreme swelling of your face, hands, ankles, feet, or legs. You have not felt your baby move in over an hour. You have severe headaches that do not go away with medicine. You have vision changes. Document Released: 01/30/2001 Document Revised: 02/10/2013 Document Reviewed: 04/08/2012 [facility] Patient Information 2015 [place], [place]. This information is not intended to replace advice given to you by your health care provider. Make sure you discuss any questions you have with your health care provider.

## 2024-03-19 ENCOUNTER — Telehealth

## 2024-03-19 VITALS — BP 115/70 | HR 86

## 2024-03-19 DIAGNOSIS — Z3A28 28 weeks gestation of pregnancy: Secondary | ICD-10-CM

## 2024-03-19 DIAGNOSIS — Z0130 Encounter for examination of blood pressure without abnormal findings: Secondary | ICD-10-CM

## 2024-03-19 LAB — COMPREHENSIVE METABOLIC PANEL WITH GFR
ALT: 13 [IU]/L (ref 0–32)
AST: 10 [IU]/L (ref 0–40)
Albumin: 3.8 g/dL — ABNORMAL LOW (ref 4.0–5.0)
Alkaline Phosphatase: 132 [IU]/L — ABNORMAL HIGH (ref 41–116)
BUN/Creatinine Ratio: 15 (ref 9–23)
BUN: 7 mg/dL (ref 6–20)
Bilirubin Total: <0.2 mg/dL (ref 0.0–1.2)
CO2: 17 mmol/L — ABNORMAL LOW (ref 20–29)
Calcium: 9.2 mg/dL (ref 8.7–10.2)
Chloride: 103 mmol/L (ref 96–106)
Creatinine, Ser: 0.47 mg/dL — ABNORMAL LOW (ref 0.57–1.00)
Globulin, Total: 2.2 g/dL (ref 1.5–4.5)
Glucose: 119 mg/dL — ABNORMAL HIGH (ref 70–99)
Potassium: 3.7 mmol/L (ref 3.5–5.2)
Sodium: 136 mmol/L (ref 134–144)
Total Protein: 6 g/dL (ref 6.0–8.5)
eGFR: 135 mL/min/{1.73_m2}

## 2024-03-19 LAB — PROTEIN / CREATININE RATIO, URINE
Creatinine, Urine: 79.7 mg/dL
Protein, Ur: 10.6 mg/dL
Protein/Creat Ratio: 133 mg/g{creat} (ref 0–200)

## 2024-03-19 NOTE — Progress Notes (Signed)
" ° °  NURSE VISIT- BLOOD PRESSURE CHECK  I connected with Shelly Andersen on 03/19/2024 by MyChart video and verified that I am speaking with the correct person using two identifiers.   I discussed the limitations of evaluation and management by telemedicine. The patient expressed understanding and agreed to proceed.  Nurse is at the office, and patient is at home.  SUBJECTIVE:  Shelly Andersen is a 24 y.o. G46P0010 female here for BP check. She is [redacted]w[redacted]d pregnant    HYPERTENSION ROS:  Pregnant:  Severe headaches that don't go away with tylenol /other medicines: No  Visual changes (seeing spots/double/blurred vision) No  Severe pain under right breast breast or in center of upper chest No  Severe nausea/vomiting No  Taking medicines as instructed not applicable   OBJECTIVE:  BP 115/70   Pulse 86   LMP 08/31/2023   Appearance alert, well appearing, and in no distress.  ASSESSMENT: Pregnancy [redacted]w[redacted]d  blood pressure check  PLAN: Discussed with Luke Fetters, CNM, [facility]   Recommendations: no changes needed   Follow-up: as scheduled   Rutherford Rover  03/19/2024 2:23 PM  "

## 2024-03-20 LAB — CBC
Hematocrit: 35.8 % (ref 34.0–46.6)
Hemoglobin: 12.1 g/dL (ref 11.1–15.9)
MCH: 31.7 pg (ref 26.6–33.0)
MCHC: 33.8 g/dL (ref 31.5–35.7)
MCV: 94 fL (ref 79–97)
Platelets: 313 10*3/uL (ref 150–450)
RBC: 3.82 x10E6/uL (ref 3.77–5.28)
RDW: 11.9 % (ref 11.7–15.4)
WBC: 15.1 10*3/uL — ABNORMAL HIGH (ref 3.4–10.8)

## 2024-03-20 LAB — HIV ANTIBODY (ROUTINE TESTING W REFLEX): HIV Screen 4th Generation wRfx: NONREACTIVE

## 2024-03-20 LAB — GLUCOSE TOLERANCE, 2 HOURS W/ 1HR
Glucose, 1 hour: 124 mg/dL (ref 70–179)
Glucose, 2 hour: 96 mg/dL (ref 70–152)
Glucose, Fasting: 81 mg/dL (ref 70–91)

## 2024-03-20 LAB — SYPHILIS: RPR W/REFLEX TO RPR TITER AND TREPONEMAL ANTIBODIES, TRADITIONAL SCREENING AND DIAGNOSIS ALGORITHM: RPR Ser Ql: NONREACTIVE

## 2024-03-20 LAB — ANTIBODY SCREEN: Antibody Screen: NEGATIVE

## 2024-03-24 ENCOUNTER — Ambulatory Visit: Payer: Self-pay | Admitting: Women's Health

## 2024-03-24 DIAGNOSIS — Z3480 Encounter for supervision of other normal pregnancy, unspecified trimester: Secondary | ICD-10-CM

## 2024-04-01 ENCOUNTER — Encounter: Admitting: Advanced Practice Midwife
